# Patient Record
Sex: Female | Born: 1995 | Race: White | Hispanic: No | Marital: Single | Smoking: Current every day smoker
Health system: Southern US, Community
[De-identification: ages and names within clinical notes are randomized; demographics above are authoritative.]

## PROBLEM LIST (undated history)

## (undated) ENCOUNTER — Inpatient Hospital Stay (HOSPITAL_COMMUNITY): Payer: Self-pay

## (undated) DIAGNOSIS — K219 Gastro-esophageal reflux disease without esophagitis: Secondary | ICD-10-CM

## (undated) DIAGNOSIS — O99331 Smoking (tobacco) complicating pregnancy, first trimester: Secondary | ICD-10-CM

## (undated) DIAGNOSIS — K589 Irritable bowel syndrome without diarrhea: Secondary | ICD-10-CM

## (undated) HISTORY — PX: TONSILLECTOMY: SUR1361

## (undated) HISTORY — DX: Irritable bowel syndrome, unspecified: K58.9

## (undated) HISTORY — DX: Smoking (tobacco) complicating pregnancy, first trimester: O99.331

## (undated) HISTORY — DX: Gastro-esophageal reflux disease without esophagitis: K21.9

## (undated) HISTORY — PX: MULTIPLE TOOTH EXTRACTIONS: SHX2053

---
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
## 2014-07-12 NOTE — L&D Delivery Note (Signed)
Delivery Note  Patient presented after [facility] 04/24/15 @0800 . FB, cytotec and pitocin were used to augment labor. Patient was complete at 1000 this morning and pushed for less than an hour before successfully delivering her baby. She is GBS positive and received adequate treatment with PCN. Patient had primary PPH. Clots and flow of blood expressed with uterine massage after delivery. Pitocin was started, uterine massage continued. Fundus firmed appropriately. Ordered follow-up CBC.   At 10:53 AM a viable and healthy female was delivered via Vaginal, Spontaneous Delivery (Presentation: Vertex; Occiput Anterior).  APGAR: 9, 9; weight pending.   Placenta status: Intact, Spontaneous.  Cord: 3 vessels with the following complications: loose nuchal cord.  Cord pH: N/A  Anesthesia: Epidural  Episiotomy: None Lacerations:  None  Suture Repair: vicryl Est. Blood Loss (mL): 647  Mom to postpartum.  Baby to Couplet care / Skin to Skin.  Jamelle HaringHillary M Fitzgerald, MD [facility], PGY-1 04/25/2015, 11:19 AM  OB fellow attestation: Patient is a G1P1001 at 3516w1d who was admitted  I was gloved and present for delivery in its entirety.  Second stage of labor progressed, baby delivered after ~50 minutes of pushing. Patient had increased bleeding after delivery with uterine atony. Received routine pitocin and then gave methergine given continued bleeding. Will get f/u CBC  Complications: none  Lacerations: none  EBL: 647  Federico FlakeKimberly Niles Monetta Lick, MD 12:16 PM

## 2014-09-11 LAB — LAB REPORT - SCANNED: Preg Test, Ur: POSITIVE

## 2014-10-08 ENCOUNTER — Encounter: Payer: Self-pay | Admitting: *Deleted

## 2014-10-16 ENCOUNTER — Encounter: Payer: Self-pay | Admitting: Advanced Practice Midwife

## 2014-10-16 ENCOUNTER — Ambulatory Visit (INDEPENDENT_AMBULATORY_CARE_PROVIDER_SITE_OTHER): Payer: No Typology Code available for payment source | Admitting: Advanced Practice Midwife

## 2014-10-16 ENCOUNTER — Encounter: Payer: Self-pay | Admitting: *Deleted

## 2014-10-16 ENCOUNTER — Other Ambulatory Visit: Payer: Self-pay | Admitting: Advanced Practice Midwife

## 2014-10-16 VITALS — BP 130/62 | HR 73 | Temp 97.5°F | Ht 64.0 in | Wt 142.3 lb

## 2014-10-16 DIAGNOSIS — O3680X1 Pregnancy with inconclusive fetal viability, fetus 1: Secondary | ICD-10-CM | POA: Diagnosis not present

## 2014-10-16 DIAGNOSIS — Z3491 Encounter for supervision of normal pregnancy, unspecified, first trimester: Secondary | ICD-10-CM | POA: Insufficient documentation

## 2014-10-16 DIAGNOSIS — Z3481 Encounter for supervision of other normal pregnancy, first trimester: Secondary | ICD-10-CM

## 2014-10-16 DIAGNOSIS — E010 Iodine-deficiency related diffuse (endemic) goiter: Secondary | ICD-10-CM

## 2014-10-16 DIAGNOSIS — F17210 Nicotine dependence, cigarettes, uncomplicated: Secondary | ICD-10-CM

## 2014-10-16 DIAGNOSIS — O99331 Smoking (tobacco) complicating pregnancy, first trimester: Secondary | ICD-10-CM | POA: Insufficient documentation

## 2014-10-16 DIAGNOSIS — E049 Nontoxic goiter, unspecified: Secondary | ICD-10-CM

## 2014-10-16 HISTORY — DX: Smoking (tobacco) complicating pregnancy, first trimester: O99.331

## 2014-10-16 LAB — POCT URINALYSIS DIP (DEVICE)
Glucose, UA: NEGATIVE mg/dL
HGB URINE DIPSTICK: NEGATIVE
KETONES UR: NEGATIVE mg/dL
Leukocytes, UA: NEGATIVE
Nitrite: NEGATIVE
PROTEIN: 30 mg/dL — AB
Specific Gravity, Urine: 1.02 (ref 1.005–1.030)
UROBILINOGEN UA: 1 mg/dL (ref 0.0–1.0)
pH: 7 (ref 5.0–8.0)

## 2014-10-16 LAB — OB RESULTS CONSOLE GC/CHLAMYDIA
CHLAMYDIA, DNA PROBE: NEGATIVE
Gonorrhea: NEGATIVE

## 2014-10-16 LAB — OB RESULTS CONSOLE GBS: STREP GROUP B AG: POSITIVE

## 2014-10-16 MED ORDER — INTEGRA F 125-1 MG PO CAPS: 1.0000 | ORAL_CAPSULE | Freq: Every day | ORAL | Status: DC

## 2014-10-16 NOTE — Progress Notes (Signed)
C/o intermittent pelvic cramping.  Initial labs today and early 1hr gtt.  Had flu vaccine at work in 06/2014.  New OB packet given.   First trimester screen scheduled for 10/24/14 1130. [facility] faxed to [phone].

## 2014-10-16 NOTE — Progress Notes (Signed)
   Subjective:    Alison Mclean is a G1P0 9311w6d by LMP and US today being seen today for her first obstetrical visit.  Her obstetrical history is significant for smoker. Patient does intend to breast feed. Pregnancy history fully reviewed.  Patient reports nausea, no bleeding and no cramping.  Filed Vitals:   10/16/14 0941 10/16/14 1001  BP: 130/62   Pulse: 73   Temp: 97.5 F (36.4 C)   Height:  5\' 4"  (1.626 m)  Weight: 142 lb 4.8 oz (64.547 kg)     HISTORY: OB History  Gravida Para Term Preterm AB SAB TAB Ectopic Multiple Living  1             # Outcome Date GA Lbr Len/2nd Weight Sex Delivery Anes PTL Lv  1 Current              Past Medical History  Diagnosis Date  . GERD (gastroesophageal reflux disease)   . Irritable bowel syndrome (IBS)    Past Surgical History  Procedure Laterality Date  . Tonsillectomy     Family History  Problem Relation Age of Onset  . Hypertension Mother   . Diabetes Mother   . Hypertension Maternal Grandmother   . Diabetes Maternal Grandmother   . Cancer Maternal Grandfather     thyroid     Exam    Uterus:   11 week live IUP by US today. Pos cardiac activity   Pelvic Exam:    Perineum: No Hemorrhoids, Normal Perineum   Vulva: normal   Vagina:  normal mucosa, normal discharge   pH: NA   Adnexa: no mass, fullness, tenderness   Bony Pelvis: average and Unproven  System: Breast:  normal appearance, no masses or tenderness   Skin: normal coloration and turgor, no rashes    Neurologic: oriented, normal mood, grossly non-focal   Extremities: normal strength, tone, and muscle mass, No edema   HEENT sclera clear, anicteric, oropharynx clear, no lesions, neck supple with midline trachea and Thyromegaly, No palpable nodules   Mouth/Teeth mucous membranes moist, pharynx normal without lesions and dental hygiene good   Neck supple and no masses   Cardiovascular: regular rate and rhythm, no murmurs or gallops   Respiratory:  appears  well, vitals normal, no respiratory distress, acyanotic, normal RR, chest clear, no wheezing, crepitations, rhonchi, normal symmetric air entry   Abdomen: soft, non-tender; bowel sounds normal; no masses,  no organomegaly   Urinary: urethral meatus normal      Assessment:    Pregnancy: G1P0 1. Supervision of normal pregnancy in first trimester  - Prenatal Profile - Glucose Tolerance, 1 HR (50g) w/o Fasting - Culture, OB Urine - Prescript Monitor Profile(19) - Amniotic fluid index - Fe Fum-FePoly-FA-Vit C-Vit B3 (INTEGRA F) 125-1 MG CAPS; Take 1 capsule by mouth daily.  Dispense: 30 capsule; Refill: 8  2. Examination to determine fetal viability of pregnancy, fetus 1  - Amniotic fluid index (Check FHR, dating)  3. Tobacco smoking affecting pregnancy in first trimester, antepartum   4. Thyromegaly  - TSH   Plan:   Initial labs drawn. Prenatal vitamins. Problem list reviewed and updated. Genetic Screening discussed First Screen: ordered. Ultrasound discussed; fetal survey: requested. Follow up in 4 weeks. Smoking cessation encouraged. Handout given.   Dorathy KinsmanSMITH, Lisette Mancebo 10/16/2014

## 2014-10-16 NOTE — Patient Instructions (Addendum)
Pregnancy and Smoking Smoking during pregnancy is unhealthy for you and your developing baby. The addictive drug nicotine, carbon monoxide, and many other poisons are inhaled from a cigarette and carried through your bloodstream to your baby. Cigarette smoke contains more than 2,500 chemicals. It is not known which of these are harmful to a developing baby. However, both nicotine and carbon monoxide play a role in causing health problems in pregnancy. Smoking during pregnancy increases the risk of:  Birth defects in your baby, including heart defects.  Miscarriage and stillbirth.  Birth before 37 completed weeks of pregnancy (premature birth).  Pregnancy outside of the uterus (tubal pregnancy).  Attachment of the placenta over the opening of the uterus (placenta previa).  Detachment of the placenta before the baby's birth (placental abruption).  Breaking of the bag of waters before labor begins (premature rupture of membranes). HOW DOES SMOKING DURING PREGNANCY AFFECT MY BABY? Before Birth Smoking during pregnancy:  Decreases blood flow and oxygen to your baby.  Increases the heart rate of your baby.  Slows your baby's growth in the uterus (intrauterine growth retardation). After Birth Babies born to women who smoke during pregnancy are more likely to have a low birth weight. They are also at risk for:  Serious health problems, chronic or lifelong disabilities (cerebral palsy, mental retardation, learning problems), and death.  Sudden infant death syndrome (SIDS).  Lung and breathing problems. WHAT RESOURCES ARE AVAILABLE TO HELP ME STOP SMOKING?  Ask your health care provider for help to stop smoking. The following resources are available:  Counseling.  Psychological treatment.  Acupuncture.  Family intervention.  Hypnosis.  Nicotine supplements have not been studied enough to know if they are safe to use during pregnancy. They should only be considered when all other  methods fail, and if used under the close supervision of your health care provider.  Telephone QUIT lines. The national smoking cessation telephone hotline number is 1-800-QUIT NOW. FOR MORE INFORMATION  American Cancer Society: www.cancer.org  American Heart Association: www.heart.org  National Cancer Institute: www.cancer.gov  March of Dimes: www.marchofdimes.org Document Released: 11/09/2004 Document Revised: 07/03/2013 Document Reviewed: 05/28/2013 [facility] Patient Information 2015 [place], [place]. This information is not intended to replace advice given to you by your health care provider. Make sure you discuss any questions you have with your health care provider.  Smoking Cessation, Tips for Success If you are ready to quit smoking, congratulations! You have chosen to help yourself be healthier. Cigarettes bring nicotine, tar, carbon monoxide, and other irritants into your body. Your lungs, heart, and blood vessels will be able to work better without these poisons. There are many different ways to quit smoking. Nicotine gum, nicotine patches, a nicotine inhaler, or nicotine nasal spray can help with physical craving. Hypnosis, support groups, and medicines help break the habit of smoking. WHAT THINGS CAN I DO TO MAKE QUITTING EASIER?  Here are some tips to help you quit for good:  Pick a date when you will quit smoking completely. Tell all of your friends and family about your plan to quit on that date.  Do not try to slowly cut down on the number of cigarettes you are smoking. Pick a quit date and quit smoking completely starting on that day.  Throw away all cigarettes.   Clean and remove all ashtrays from your home, work, and car.  On a card, write down your reasons for quitting. Carry the card with you and read it when you get the urge to smoke.  Cleanse your body of nicotine. Drink enough water and fluids to keep your urine clear or pale yellow. Do this after quitting to  flush the nicotine from your body.  Learn to predict your moods. Do not let a bad situation be your excuse to have a cigarette. Some situations in your life might tempt you into wanting a cigarette.  Never have "just one" cigarette. It leads to wanting another and another. Remind yourself of your decision to quit.  Change habits associated with smoking. If you smoked while driving or when feeling stressed, try other activities to replace smoking. Stand up when drinking your coffee. Brush your teeth after eating. Sit in a different chair when you read the paper. Avoid alcohol while trying to quit, and try to drink fewer caffeinated beverages. Alcohol and caffeine may urge you to smoke.  Avoid foods and drinks that can trigger a desire to smoke, such as sugary or spicy foods and alcohol.  Ask people who smoke not to smoke around you.  Have something planned to do right after eating or having a cup of coffee. For example, plan to take a walk or exercise.  Try a relaxation exercise to calm you down and decrease your stress. Remember, you may be tense and nervous for the first 2 weeks after you quit, but this will pass.  Find new activities to keep your hands busy. Play with a pen, coin, or rubber band. Doodle or draw things on paper.  Brush your teeth right after eating. This will help cut down on the craving for the taste of tobacco after meals. You can also try mouthwash.   Use oral substitutes in place of cigarettes. Try using lemon drops, carrots, cinnamon sticks, or chewing gum. Keep them handy so they are available when you have the urge to smoke.  When you have the urge to smoke, try deep breathing.  Designate your home as a nonsmoking area.  If you are a heavy smoker, ask your health care provider about a prescription for nicotine chewing gum. It can ease your withdrawal from nicotine.  Reward yourself. Set aside the cigarette money you save and buy yourself something nice.  Look  for support from others. Join a support group or smoking cessation program. Ask someone at home or at work to help you with your plan to quit smoking.  Always ask yourself, "Do I need this cigarette or is this just a reflex?" Tell yourself, "Today, I choose not to smoke," or "I do not want to smoke." You are reminding yourself of your decision to quit.  Do not replace cigarette smoking with electronic cigarettes (commonly called e-cigarettes). The safety of e-cigarettes is unknown, and some may contain harmful chemicals.  If you relapse, do not give up! Plan ahead and think about what you will do the next time you get the urge to smoke. HOW WILL I FEEL WHEN I QUIT SMOKING? You may have symptoms of withdrawal because your body is used to nicotine (the addictive substance in cigarettes). You may crave cigarettes, be irritable, feel very hungry, cough often, get headaches, or have difficulty concentrating. The withdrawal symptoms are only temporary. They are strongest when you first quit but will go away within 10-14 days. When withdrawal symptoms occur, stay in control. Think about your reasons for quitting. Remind yourself that these are signs that your body is healing and getting used to being without cigarettes. Remember that withdrawal symptoms are easier to treat than the major diseases that  smoking can cause.  Even after the withdrawal is over, expect periodic urges to smoke. However, these cravings are generally short lived and will go away whether you smoke or not. Do not smoke! WHAT RESOURCES ARE AVAILABLE TO HELP ME QUIT SMOKING? Your health care provider can direct you to community resources or hospitals for support, which may include:  Group support.  Education.  Hypnosis.  Therapy. Document Released: 03/26/2004 Document Revised: 11/12/2013 Document Reviewed: 12/14/2012 [facility] Patient Information 2015 [place], [place]. This information is not intended to replace advice given to you by  your health care provider. Make sure you discuss any questions you have with your health care provider.   First Trimester of Pregnancy The first trimester of pregnancy is from week 1 until the end of week 12 (months 1 through 3). A week after a sperm fertilizes an egg, the egg will implant on the wall of the uterus. This embryo will begin to develop into a baby. Genes from you and your partner are forming the baby. The female genes determine whether the baby is a boy or a girl. At 6-8 weeks, the eyes and face are formed, and the heartbeat can be seen on ultrasound. At the end of 12 weeks, all the baby's organs are formed.  Now that you are pregnant, you will want to do everything you can to have a healthy baby. Two of the most important things are to get good prenatal care and to follow your health care provider's instructions. Prenatal care is all the medical care you receive before the baby's birth. This care will help prevent, find, and treat any problems during the pregnancy and childbirth. BODY CHANGES Your body goes through many changes during pregnancy. The changes vary from woman to woman.   You may gain or lose a couple of pounds at first.  You may feel sick to your stomach (nauseous) and throw up (vomit). If the vomiting is uncontrollable, call your health care provider.  You may tire easily.  You may develop headaches that can be relieved by medicines approved by your health care provider.  You may urinate more often. Painful urination may mean you have a bladder infection.  You may develop heartburn as a result of your pregnancy.  You may develop constipation because certain hormones are causing the muscles that push waste through your intestines to slow down.  You may develop hemorrhoids or swollen, bulging veins (varicose veins).  Your breasts may begin to grow larger and become tender. Your nipples may stick out more, and the tissue that surrounds them (areola) may become  darker.  Your gums may bleed and may be sensitive to brushing and flossing.  Dark spots or blotches (chloasma, mask of pregnancy) may develop on your face. This will likely fade after the baby is born.  Your menstrual periods will stop.  You may have a loss of appetite.  You may develop cravings for certain kinds of food.  You may have changes in your emotions from day to day, such as being excited to be pregnant or being concerned that something may go wrong with the pregnancy and baby.  You may have more vivid and strange dreams.  You may have changes in your hair. These can include thickening of your hair, rapid growth, and changes in texture. Some women also have hair loss during or after pregnancy, or hair that feels dry or thin. Your hair will most likely return to normal after your baby is born. WHAT TO  EXPECT AT YOUR PRENATAL VISITS During a routine prenatal visit:  You will be weighed to make sure you and the baby are growing normally.  Your blood pressure will be taken.  Your abdomen will be measured to track your baby's growth.  The fetal heartbeat will be listened to starting around week 10 or 12 of your pregnancy.  Test results from any previous visits will be discussed. Your health care provider may ask you:  How you are feeling.  If you are feeling the baby move.  If you have had any abnormal symptoms, such as leaking fluid, bleeding, severe headaches, or abdominal cramping.  If you have any questions. Other tests that may be performed during your first trimester include:  Blood tests to find your blood type and to check for the presence of any previous infections. They will also be used to check for low iron levels (anemia) and Rh antibodies. Later in the pregnancy, blood tests for diabetes will be done along with other tests if problems develop.  Urine tests to check for infections, diabetes, or protein in the urine.  An ultrasound to confirm the proper  growth and development of the baby.  An amniocentesis to check for possible genetic problems.  Fetal screens for spina bifida and Down syndrome.  You may need other tests to make sure you and the baby are doing well. HOME CARE INSTRUCTIONS  Medicines  Follow your health care provider's instructions regarding medicine use. Specific medicines may be either safe or unsafe to take during pregnancy.  Take your prenatal vitamins as directed.  If you develop constipation, try taking a stool softener if your health care provider approves. Diet  Eat regular, well-balanced meals. Choose a variety of foods, such as meat or vegetable-based protein, fish, milk and low-fat dairy products, vegetables, fruits, and whole grain breads and cereals. Your health care provider will help you determine the amount of weight gain that is right for you.  Avoid raw meat and uncooked cheese. These carry germs that can cause birth defects in the baby.  Eating four or five small meals rather than three large meals a day may help relieve nausea and vomiting. If you start to feel nauseous, eating a few soda crackers can be helpful. Drinking liquids between meals instead of during meals also seems to help nausea and vomiting.  If you develop constipation, eat more high-fiber foods, such as fresh vegetables or fruit and whole grains. Drink enough fluids to keep your urine clear or pale yellow. Activity and Exercise  Exercise only as directed by your health care provider. Exercising will help you:  Control your weight.  Stay in shape.  Be prepared for labor and delivery.  Experiencing pain or cramping in the lower abdomen or low back is a good sign that you should stop exercising. Check with your health care provider before continuing normal exercises.  Try to avoid standing for long periods of time. Move your legs often if you must stand in one place for a long time.  Avoid heavy lifting.  Wear low-heeled  shoes, and practice good posture.  You may continue to have sex unless your health care provider directs you otherwise. Relief of Pain or Discomfort  Wear a good support bra for breast tenderness.   Take warm sitz baths to soothe any pain or discomfort caused by hemorrhoids. Use hemorrhoid cream if your health care provider approves.   Rest with your legs elevated if you have leg cramps or low  back pain.  If you develop varicose veins in your legs, wear support hose. Elevate your feet for 15 minutes, 3-4 times a day. Limit salt in your diet. Prenatal Care  Schedule your prenatal visits by the twelfth week of pregnancy. They are usually scheduled monthly at first, then more often in the last 2 months before delivery.  Write down your questions. Take them to your prenatal visits.  Keep all your prenatal visits as directed by your health care provider. Safety  Wear your seat belt at all times when driving.  Make a list of emergency phone numbers, including numbers for family, friends, the hospital, and police and fire departments. General Tips  Ask your health care provider for a referral to a local prenatal education class. Begin classes no later than at the beginning of month 6 of your pregnancy.  Ask for help if you have counseling or nutritional needs during pregnancy. Your health care provider can offer advice or refer you to specialists for help with various needs.  Do not use hot tubs, steam rooms, or saunas.  Do not douche or use tampons or scented sanitary pads.  Do not cross your legs for long periods of time.  Avoid cat litter boxes and soil used by cats. These carry germs that can cause birth defects in the baby and possibly loss of the fetus by miscarriage or stillbirth.  Avoid all smoking, herbs, alcohol, and medicines not prescribed by your health care provider. Chemicals in these affect the formation and growth of the baby.  Schedule a dentist appointment. At  home, brush your teeth with a soft toothbrush and be gentle when you floss. SEEK MEDICAL CARE IF:   You have dizziness.  You have mild pelvic cramps, pelvic pressure, or nagging pain in the abdominal area.  You have persistent nausea, vomiting, or diarrhea.  You have a bad smelling vaginal discharge.  You have pain with urination.  You notice increased swelling in your face, hands, legs, or ankles. SEEK IMMEDIATE MEDICAL CARE IF:   You have a fever.  You are leaking fluid from your vagina.  You have spotting or bleeding from your vagina.  You have severe abdominal cramping or pain.  You have rapid weight gain or loss.  You vomit blood or material that looks like coffee grounds.  You are exposed to [place] measles and have never had them.  You are exposed to fifth disease or chickenpox.  You develop a severe headache.  You have shortness of breath.  You have any kind of trauma, such as from a fall or a car accident. Document Released: 06/22/2001 Document Revised: 11/12/2013 Document Reviewed: 05/08/2013 [facility] Patient Information 2015 [place], [place]. This information is not intended to replace advice given to you by your health care provider. Make sure you discuss any questions you have with your health care provider.

## 2014-10-16 NOTE — Progress Notes (Signed)
Bedside US for viability performed. Single IUP, FHR = 164 per PW doppler, CRL = 4.11cm (11w 0d), FM present

## 2014-10-17 ENCOUNTER — Encounter: Payer: Self-pay | Admitting: Advanced Practice Midwife

## 2014-10-17 ENCOUNTER — Other Ambulatory Visit: Payer: Self-pay | Admitting: Advanced Practice Midwife

## 2014-10-17 DIAGNOSIS — Z3682 Encounter for antenatal screening for nuchal translucency: Secondary | ICD-10-CM

## 2014-10-17 LAB — GLUCOSE TOLERANCE, 1 HOUR (50G) W/O FASTING: Glucose, 1 Hour GTT: 57 mg/dL — ABNORMAL LOW (ref 70–140)

## 2014-10-17 LAB — TSH: TSH: 0.811 u[IU]/mL (ref 0.350–4.500)

## 2014-10-17 LAB — GC/CHLAMYDIA PROBE AMP
CT Probe RNA: NEGATIVE
GC Probe RNA: NEGATIVE

## 2014-10-18 LAB — PRENATAL PROFILE (SOLSTAS)
ANTIBODY SCREEN: NEGATIVE
BASOS PCT: 0 % (ref 0–1)
Basophils Absolute: 0 10*3/uL (ref 0.0–0.1)
EOS ABS: 0.2 10*3/uL (ref 0.0–0.7)
Eosinophils Relative: 2 % (ref 0–5)
HEMATOCRIT: 37.3 % (ref 36.0–46.0)
HEP B S AG: NEGATIVE
HIV 1&2 Ab, 4th Generation: NONREACTIVE
Hemoglobin: 12.7 g/dL (ref 12.0–15.0)
Lymphocytes Relative: 18 % (ref 12–46)
Lymphs Abs: 1.4 10*3/uL (ref 0.7–4.0)
MCH: 31.5 pg (ref 26.0–34.0)
MCHC: 34 g/dL (ref 30.0–36.0)
MCV: 92.6 fL (ref 78.0–100.0)
MPV: 9.5 fL (ref 8.6–12.4)
Monocytes Absolute: 0.8 10*3/uL (ref 0.1–1.0)
Monocytes Relative: 10 % (ref 3–12)
NEUTROS ABS: 5.3 10*3/uL (ref 1.7–7.7)
Neutrophils Relative %: 70 % (ref 43–77)
PLATELETS: 249 10*3/uL (ref 150–400)
RBC: 4.03 MIL/uL (ref 3.87–5.11)
RDW: 13.5 % (ref 11.5–15.5)
Rh Type: POSITIVE
Rubella: 1.61 Index — ABNORMAL HIGH (ref <0.90)
WBC: 7.6 10*3/uL (ref 4.0–10.5)

## 2014-10-19 LAB — CULTURE, OB URINE

## 2014-10-20 LAB — CANNABANOIDS (GC/LC/MS), URINE: THC-COOH (GC/LC/MS), ur confirm: 670 ng/mL — AB (ref <5)

## 2014-10-21 ENCOUNTER — Other Ambulatory Visit: Payer: Self-pay | Admitting: Advanced Practice Midwife

## 2014-10-21 ENCOUNTER — Encounter: Payer: Self-pay | Admitting: Advanced Practice Midwife

## 2014-10-21 DIAGNOSIS — R8271 Bacteriuria: Secondary | ICD-10-CM | POA: Insufficient documentation

## 2014-10-21 MED ORDER — AMOXICILLIN 500 MG PO CAPS: 500.0000 mg | ORAL_CAPSULE | Freq: Three times a day (TID) | ORAL | Status: DC

## 2014-10-21 NOTE — Progress Notes (Signed)
GBS bacteriuria. Rx Amox.

## 2014-10-22 ENCOUNTER — Encounter: Payer: Self-pay | Admitting: *Deleted

## 2014-10-22 ENCOUNTER — Telehealth: Payer: Self-pay | Admitting: *Deleted

## 2014-10-22 LAB — PRESCRIPTION MONITORING PROFILE (19 PANEL)
Amphetamine/Meth: NEGATIVE ng/mL
Barbiturate Screen, Urine: NEGATIVE ng/mL
Benzodiazepine Screen, Urine: NEGATIVE ng/mL
Buprenorphine, Urine: NEGATIVE ng/mL
COCAINE METABOLITES: NEGATIVE ng/mL
Carisoprodol, Urine: NEGATIVE ng/mL
Creatinine, Urine: 274.83 mg/dL (ref >20.0)
ECSTASY: NEGATIVE ng/mL
Fentanyl, Ur: NEGATIVE ng/mL
MEPERIDINE UR: NEGATIVE ng/mL
METHADONE SCREEN, URINE: NEGATIVE ng/mL
Methaqualone: NEGATIVE ng/mL
NITRITES URINE, INITIAL: NEGATIVE ug/mL
Opiate Screen, Urine: NEGATIVE ng/mL
Oxycodone Screen, Ur: NEGATIVE ng/mL
PROPOXYPHENE: NEGATIVE ng/mL
Phencyclidine, Ur: NEGATIVE ng/mL
Tapentadol, urine: NEGATIVE ng/mL
Tramadol Scrn, Ur: NEGATIVE ng/mL
ZOLPIDEM, URINE: NEGATIVE ng/mL
pH, Initial: 8.4 pH (ref 4.5–8.9)

## 2014-10-22 NOTE — Telephone Encounter (Signed)
Called pt to notify her of need for antibiotic to treat UTI and heard a message stating that the number has been disconnected. Pt also does not have an active MyChart account. No additional contact numbers available in demographics. Pt will be sent a certified letter regarding test result and medication ordered.

## 2014-10-24 ENCOUNTER — Encounter (HOSPITAL_COMMUNITY): Payer: Self-pay

## 2014-10-24 ENCOUNTER — Ambulatory Visit (HOSPITAL_COMMUNITY)
Admission: RE | Admit: 2014-10-24 | Discharge: 2014-10-24 | Disposition: A | Discharge status: Home / Self Care | Payer: No Typology Code available for payment source | Source: Ambulatory Visit | Attending: Advanced Practice Midwife | Admitting: Advanced Practice Midwife

## 2014-10-24 DIAGNOSIS — Z3A12 12 weeks gestation of pregnancy: Secondary | ICD-10-CM | POA: Insufficient documentation

## 2014-10-24 DIAGNOSIS — Z3682 Encounter for antenatal screening for nuchal translucency: Secondary | ICD-10-CM

## 2014-10-24 DIAGNOSIS — Z36 Encounter for antenatal screening of mother: Secondary | ICD-10-CM | POA: Diagnosis not present

## 2014-11-03 ENCOUNTER — Encounter: Payer: Self-pay | Admitting: Advanced Practice Midwife

## 2014-11-03 DIAGNOSIS — F1290 Cannabis use, unspecified, uncomplicated: Secondary | ICD-10-CM | POA: Insufficient documentation

## 2014-11-05 ENCOUNTER — Telehealth: Payer: Self-pay | Admitting: General Practice

## 2014-11-05 ENCOUNTER — Telehealth (HOSPITAL_COMMUNITY): Payer: Self-pay | Admitting: *Deleted

## 2014-11-05 ENCOUNTER — Other Ambulatory Visit (HOSPITAL_COMMUNITY): Payer: Self-pay | Admitting: Advanced Practice Midwife

## 2014-11-05 NOTE — Telephone Encounter (Signed)
Patient called and left message requesting results. Called patient and informed her of normal results. Patient verbalized understanding and had no questions

## 2014-11-05 NOTE — Telephone Encounter (Signed)
Pt called name and DOB verified. Pt notified of normal first trimester screen result.

## 2014-11-13 ENCOUNTER — Encounter: Payer: No Typology Code available for payment source | Admitting: Advanced Practice Midwife

## 2014-11-15 ENCOUNTER — Telehealth: Payer: Self-pay | Admitting: *Deleted

## 2014-11-15 NOTE — Telephone Encounter (Signed)
Alison Mclean called and left a message stating the past couple of mornings when she gets up her stomach is hurting- " kind of feels like cramping or upset stomach". Wants a call back.  Called Alison Mclean and she c/o mostly in the morning stomach feeling upset, has thrown up a few times and also having cramps sometimes. She denies any vaginal bleeding . States last week she had some wetness on her panties, but none since.  We discussed she could still be having some morning sickness which would be normal. We also discussed her c/o more gas than usual- what otc she could use for gas/ indigestion and to avoid lying flat for 2 hours after meals and snacks. We discussed normal to have white or mucous vaginal discharge, but can not say for sure if wetness was fluid or discharge. She states she is feeling her baby move at times and denies frequent cramps, vaginal bleeding or severe pains. States she has sharp pains occasionally in her hips- we discussed round ligament pains. I advised her may always come to [facility] for evaluation and would recommend she come if she has increased cramping, any vaginal bleeding or wetness again or severe pain.  I reviewed her next ob appointment with her for next week. She voices understanding.

## 2014-11-20 ENCOUNTER — Encounter: Payer: Self-pay | Admitting: Physician Assistant

## 2014-11-20 ENCOUNTER — Ambulatory Visit (INDEPENDENT_AMBULATORY_CARE_PROVIDER_SITE_OTHER): Payer: No Typology Code available for payment source | Admitting: Physician Assistant

## 2014-11-20 VITALS — BP 127/63 | HR 62 | Wt 151.3 lb

## 2014-11-20 DIAGNOSIS — O26892 Other specified pregnancy related conditions, second trimester: Secondary | ICD-10-CM

## 2014-11-20 DIAGNOSIS — O2342 Unspecified infection of urinary tract in pregnancy, second trimester: Secondary | ICD-10-CM

## 2014-11-20 DIAGNOSIS — B951 Streptococcus, group B, as the cause of diseases classified elsewhere: Secondary | ICD-10-CM

## 2014-11-20 DIAGNOSIS — M549 Dorsalgia, unspecified: Secondary | ICD-10-CM

## 2014-11-20 DIAGNOSIS — O9989 Other specified diseases and conditions complicating pregnancy, childbirth and the puerperium: Secondary | ICD-10-CM

## 2014-11-20 DIAGNOSIS — R8271 Bacteriuria: Secondary | ICD-10-CM

## 2014-11-20 DIAGNOSIS — Z3402 Encounter for supervision of normal first pregnancy, second trimester: Secondary | ICD-10-CM

## 2014-11-20 DIAGNOSIS — O99891 Other specified diseases and conditions complicating pregnancy: Secondary | ICD-10-CM

## 2014-11-20 LAB — POCT URINALYSIS DIP (DEVICE)
Bilirubin Urine: NEGATIVE
Glucose, UA: NEGATIVE mg/dL
HGB URINE DIPSTICK: NEGATIVE
Ketones, ur: NEGATIVE mg/dL
Nitrite: NEGATIVE
PROTEIN: NEGATIVE mg/dL
Specific Gravity, Urine: 1.025 (ref 1.005–1.030)
UROBILINOGEN UA: 1 mg/dL (ref 0.0–1.0)
pH: 6.5 (ref 5.0–8.0)

## 2014-11-20 NOTE — Progress Notes (Signed)
Has cramping. Has not taken antibiotics for gbs uti.

## 2014-11-20 NOTE — Progress Notes (Signed)
15 weeks, stable. Denies VB, dysuria.  States she has occasional times of wet underwear and not sure why.   Back pain x several months.  Palpable knot on right in lower back.  Tylenol no help.   Spec exam done. Small to mod amt of thin, white discharge.  Sample collected for wet prep.   Ortho referral for back pain/knot.   Anatomy u/s scheduled.   RTC 4 weeks

## 2014-11-21 LAB — WET PREP, GENITAL: TRICH WET PREP: NONE SEEN

## 2014-11-23 ENCOUNTER — Telehealth: Payer: Self-pay | Admitting: Physician Assistant

## 2014-11-23 NOTE — Telephone Encounter (Signed)
Entered in error

## 2014-11-25 ENCOUNTER — Telehealth: Payer: Self-pay

## 2014-11-25 DIAGNOSIS — B9689 Other specified bacterial agents as the cause of diseases classified elsewhere: Secondary | ICD-10-CM

## 2014-11-25 DIAGNOSIS — B379 Candidiasis, unspecified: Secondary | ICD-10-CM

## 2014-11-25 DIAGNOSIS — N760 Acute vaginitis: Principal | ICD-10-CM

## 2014-11-25 MED ORDER — METRONIDAZOLE 500 MG PO TABS: 500.0000 mg | ORAL_TABLET | Freq: Two times a day (BID) | ORAL | Status: DC

## 2014-11-25 MED ORDER — TERCONAZOLE 0.4 % VA CREA: 1.0000 | TOPICAL_CREAM | Freq: Every day | VAGINAL | Status: AC

## 2014-11-25 NOTE — Telephone Encounter (Signed)
Medications e-prescribed. Called patient and informed her of results and RX at pharmacy. Patient verbalized understanding and gratitude. No questions or concerns.

## 2014-11-25 NOTE — Telephone Encounter (Signed)
-----   Message from Bertram DenverKaren E Teague Clark, PA-C sent at 11/23/2014  9:12 AM EDT ----- Mellody DrownWet prep positive for Bacterial Vaginosis and Yeast.  Please call patient to inform.  Please send in rx from Flagyl 500mg  bid x 7 days.  Disp #14.    Also rx for Terazole vaginal cream Use 1 app vaginally qhs x 7 nights.  Disp 45 grams.   Thanks, KTC

## 2014-11-26 ENCOUNTER — Telehealth: Payer: Self-pay | Admitting: *Deleted

## 2014-11-26 DIAGNOSIS — B373 Candidiasis of vulva and vagina: Secondary | ICD-10-CM

## 2014-11-26 DIAGNOSIS — B3731 Acute candidiasis of vulva and vagina: Secondary | ICD-10-CM

## 2014-11-26 NOTE — Telephone Encounter (Signed)
Patient called and stated that the cream prescribed for her was too expensive. Would like alternative.

## 2014-11-27 MED ORDER — FLUCONAZOLE 150 MG PO TABS: 150.0000 mg | ORAL_TABLET | Freq: Once | ORAL | Status: DC

## 2014-11-27 NOTE — Telephone Encounter (Signed)
Spoke with Alison Mclean she stated patient can use otc monistat 7 and we can prescribe a diflucan to take after the flagyl is completed. Patient informed and had no further questions.

## 2014-11-29 ENCOUNTER — Encounter: Payer: Self-pay | Admitting: General Practice

## 2014-12-18 ENCOUNTER — Ambulatory Visit (HOSPITAL_COMMUNITY)
Admission: RE | Admit: 2014-12-18 | Discharge: 2014-12-18 | Disposition: A | Discharge status: Home / Self Care | Payer: No Typology Code available for payment source | Source: Ambulatory Visit | Attending: Physician Assistant | Admitting: Physician Assistant

## 2014-12-18 ENCOUNTER — Encounter: Payer: Self-pay | Admitting: Obstetrics and Gynecology

## 2014-12-18 ENCOUNTER — Ambulatory Visit (INDEPENDENT_AMBULATORY_CARE_PROVIDER_SITE_OTHER): Payer: No Typology Code available for payment source | Admitting: Obstetrics and Gynecology

## 2014-12-18 VITALS — BP 112/64 | HR 57 | Wt 159.2 lb

## 2014-12-18 DIAGNOSIS — Z36 Encounter for antenatal screening of mother: Secondary | ICD-10-CM | POA: Diagnosis present

## 2014-12-18 DIAGNOSIS — F17210 Nicotine dependence, cigarettes, uncomplicated: Secondary | ICD-10-CM

## 2014-12-18 DIAGNOSIS — Z3A19 19 weeks gestation of pregnancy: Secondary | ICD-10-CM | POA: Diagnosis not present

## 2014-12-18 DIAGNOSIS — R8271 Bacteriuria: Secondary | ICD-10-CM

## 2014-12-18 DIAGNOSIS — O99332 Smoking (tobacco) complicating pregnancy, second trimester: Secondary | ICD-10-CM | POA: Insufficient documentation

## 2014-12-18 DIAGNOSIS — B951 Streptococcus, group B, as the cause of diseases classified elsewhere: Secondary | ICD-10-CM

## 2014-12-18 DIAGNOSIS — O2332 Infections of other parts of urinary tract in pregnancy, second trimester: Secondary | ICD-10-CM

## 2014-12-18 DIAGNOSIS — Z3402 Encounter for supervision of normal first pregnancy, second trimester: Secondary | ICD-10-CM

## 2014-12-18 LAB — POCT URINALYSIS DIP (DEVICE)
Bilirubin Urine: NEGATIVE
Glucose, UA: NEGATIVE mg/dL
Hgb urine dipstick: NEGATIVE
KETONES UR: NEGATIVE mg/dL
Leukocytes, UA: NEGATIVE
Nitrite: NEGATIVE
PH: 6.5 (ref 5.0–8.0)
Protein, ur: NEGATIVE mg/dL
Specific Gravity, Urine: 1.025 (ref 1.005–1.030)
Urobilinogen, UA: 1 mg/dL (ref 0.0–1.0)

## 2014-12-18 NOTE — Patient Instructions (Addendum)
Second Trimester of Pregnancy The second trimester is from week 13 through week 28, months 4 through 6. The second trimester is often a time when you feel your best. Your body has also adjusted to being pregnant, and you begin to feel better physically. Usually, morning sickness has lessened or quit completely, you may have more energy, and you may have an increase in appetite. The second trimester is also a time when the fetus is growing rapidly. At the end of the sixth month, the fetus is about 9 inches long and weighs about 1 pounds. You will likely begin to feel the baby move (quickening) between 18 and 20 weeks of the pregnancy. BODY CHANGES Your body goes through many changes during pregnancy. The changes vary from woman to woman.   Your weight will continue to increase. You will notice your lower abdomen bulging out.  You may begin to get stretch marks on your hips, abdomen, and breasts.  You may develop headaches that can be relieved by medicines approved by your health care provider.  You may urinate more often because the fetus is pressing on your bladder.  You may develop or continue to have heartburn as a result of your pregnancy.  You may develop constipation because certain hormones are causing the muscles that push waste through your intestines to slow down.  You may develop hemorrhoids or swollen, bulging veins (varicose veins).  You may have back pain because of the weight gain and pregnancy hormones relaxing your joints between the bones in your pelvis and as a result of a shift in weight and the muscles that support your balance.  Your breasts will continue to grow and be tender.  Your gums may bleed and may be sensitive to brushing and flossing.  Dark spots or blotches (chloasma, mask of pregnancy) may develop on your face. This will likely fade after the baby is born.  A dark line from your belly button to the pubic area (linea nigra) may appear. This will likely fade  after the baby is born.  You may have changes in your hair. These can include thickening of your hair, rapid growth, and changes in texture. Some women also have hair loss during or after pregnancy, or hair that feels dry or thin. Your hair will most likely return to normal after your baby is born. WHAT TO EXPECT AT YOUR PRENATAL VISITS During a routine prenatal visit:  You will be weighed to make sure you and the fetus are growing normally.  Your blood pressure will be taken.  Your abdomen will be measured to track your baby's growth.  The fetal heartbeat will be listened to.  Any test results from the previous visit will be discussed. Your health care provider may ask you:  How you are feeling.  If you are feeling the baby move.  If you have had any abnormal symptoms, such as leaking fluid, bleeding, severe headaches, or abdominal cramping.  If you have any questions. Other tests that may be performed during your second trimester include:  Blood tests that check for:  Low iron levels (anemia).  Gestational diabetes (between 24 and 28 weeks).  Rh antibodies.  Urine tests to check for infections, diabetes, or protein in the urine.  An ultrasound to confirm the proper growth and development of the baby.  An amniocentesis to check for possible genetic problems.  Fetal screens for spina bifida and Down syndrome. HOME CARE INSTRUCTIONS   Avoid all smoking, herbs, alcohol, and unprescribed   drugs. These chemicals affect the formation and growth of the baby.  Follow your health care provider's instructions regarding medicine use. There are medicines that are either safe or unsafe to take during pregnancy.  Exercise only as directed by your health care provider. Experiencing uterine cramps is a good sign to stop exercising.  Continue to eat regular, healthy meals.  Wear a good support bra for breast tenderness.  Do not use hot tubs, steam rooms, or saunas.  Wear your  seat belt at all times when driving.  Avoid raw meat, uncooked cheese, cat litter boxes, and soil used by cats. These carry germs that can cause birth defects in the baby.  Take your prenatal vitamins.  Try taking a stool softener (if your health care provider approves) if you develop constipation. Eat more high-fiber foods, such as fresh vegetables or fruit and whole grains. Drink plenty of fluids to keep your urine clear or pale yellow.  Take warm sitz baths to soothe any pain or discomfort caused by hemorrhoids. Use hemorrhoid cream if your health care provider approves.  If you develop varicose veins, wear support hose. Elevate your feet for 15 minutes, 3-4 times a day. Limit salt in your diet.  Avoid heavy lifting, wear low heel shoes, and practice good posture.  Rest with your legs elevated if you have leg cramps or low back pain.  Visit your dentist if you have not gone yet during your pregnancy. Use a soft toothbrush to brush your teeth and be gentle when you floss.  A sexual relationship may be continued unless your health care provider directs you otherwise.  Continue to go to all your prenatal visits as directed by your health care provider. SEEK MEDICAL CARE IF:   You have dizziness.  You have mild pelvic cramps, pelvic pressure, or nagging pain in the abdominal area.  You have persistent nausea, vomiting, or diarrhea.  You have a bad smelling vaginal discharge.  You have pain with urination. SEEK IMMEDIATE MEDICAL CARE IF:   You have a fever.  You are leaking fluid from your vagina.  You have spotting or bleeding from your vagina.  You have severe abdominal cramping or pain.  You have rapid weight gain or loss.  You have shortness of breath with chest pain.  You notice sudden or extreme swelling of your face, hands, ankles, feet, or legs.  You have not felt your baby move in over an hour.  You have severe headaches that do not go away with  medicine.  You have vision changes. Document Released: 06/22/2001 Document Revised: 07/03/2013 Document Reviewed: 08/29/2012 [facility] LPExitCare Patient Information 2015 [place], [place]. This information is not intended to replace advice given to you by your health care provider. Make sure you discuss any questions you have with your health care provider. Smoking Cessation Quitting smoking is important to your health and has many advantages. However, it is not always easy to quit since nicotine is a very addictive drug. Oftentimes, people try 3 times or more before being able to quit. This document explains the best ways for you to prepare to quit smoking. Quitting takes hard work and a lot of effort, but you can do it. ADVANTAGES OF QUITTING SMOKING  You will live longer, feel better, and live better.  Your body will feel the impact of quitting smoking almost immediately.  Within 20 minutes, blood pressure decreases. Your pulse returns to its normal level.  After 8 hours, carbon monoxide levels in the blood return  to normal. Your oxygen level increases.  After 24 hours, the chance of having a heart attack starts to decrease. Your breath, hair, and body stop smelling like smoke.  After 48 hours, damaged nerve endings begin to recover. Your sense of taste and smell improve.  After 72 hours, the body is virtually free of nicotine. Your bronchial tubes relax and breathing becomes easier.  After 2 to 12 weeks, lungs can hold more air. Exercise becomes easier and circulation improves.  The risk of having a heart attack, stroke, cancer, or lung disease is greatly reduced.  After 1 year, the risk of coronary heart disease is cut in half.  After 5 years, the risk of stroke falls to the same as a nonsmoker.  After 10 years, the risk of lung cancer is cut in half and the risk of other cancers decreases significantly.  After 15 years, the risk of coronary heart disease drops, usually to the level of a  nonsmoker.  If you are pregnant, quitting smoking will improve your chances of having a healthy baby.  The people you live with, especially any children, will be healthier.  You will have extra money to spend on things other than cigarettes. QUESTIONS TO THINK ABOUT BEFORE ATTEMPTING TO QUIT You may want to talk about your answers with your health care provider.  Why do you want to quit?  If you tried to quit in the past, what helped and what did not?  What will be the most difficult situations for you after you quit? How will you plan to handle them?  Who can help you through the tough times? Your family? Friends? A health care provider?  What pleasures do you get from smoking? What ways can you still get pleasure if you quit? Here are some questions to ask your health care provider:  How can you help me to be successful at quitting?  What medicine do you think would be best for me and how should I take it?  What should I do if I need more help?  What is smoking withdrawal like? How can I get information on withdrawal? GET READY  Set a quit date.  Change your environment by getting rid of all cigarettes, ashtrays, matches, and lighters in your home, car, or work. Do not let people smoke in your home.  Review your past attempts to quit. Think about what worked and what did not. GET SUPPORT AND ENCOURAGEMENT You have a better chance of being successful if you have help. You can get support in many ways.  Tell your family, friends, and coworkers that you are going to quit and need their support. Ask them not to smoke around you.  Get individual, group, or telephone counseling and support. Programs are available at [facility]. Call your local health department for information about programs in your area.  Spiritual beliefs and practices may help some smokers quit.  Download a "quit meter" on your computer to keep track of quit statistics, such as how  long you have gone without smoking, cigarettes not smoked, and money saved.  Get a self-help book about quitting smoking and staying off tobacco. LEARN NEW SKILLS AND BEHAVIORS  Distract yourself from urges to smoke. Talk to someone, go for a walk, or occupy your time with a task.  Change your normal routine. Take a different route to work. Drink tea instead of coffee. Eat breakfast in a different place.  Reduce your stress. Take a hot  bath, exercise, or read a book.  Plan something enjoyable to do every day. Reward yourself for not smoking.  Explore interactive web-based programs that specialize in helping you quit. GET MEDICINE AND USE IT CORRECTLY Medicines can help you stop smoking and decrease the urge to smoke. Combining medicine with the above behavioral methods and support can greatly increase your chances of successfully quitting smoking.  Nicotine replacement therapy helps deliver nicotine to your body without the negative effects and risks of smoking. Nicotine replacement therapy includes nicotine gum, lozenges, inhalers, nasal sprays, and skin patches. Some may be available over-the-counter and others require a prescription.  Antidepressant medicine helps people abstain from smoking, but how this works is unknown. This medicine is available by prescription.  Nicotinic receptor partial agonist medicine simulates the effect of nicotine in your brain. This medicine is available by prescription. Ask your health care provider for advice about which medicines to use and how to use them based on your health history. Your health care provider will tell you what side effects to look out for if you choose to be on a medicine or therapy. Carefully read the information on the package. Do not use any other product containing nicotine while using a nicotine replacement product.  RELAPSE OR DIFFICULT SITUATIONS Most relapses occur within the first 3 months after quitting. Do not be discouraged  if you start smoking again. Remember, most people try several times before finally quitting. You may have symptoms of withdrawal because your body is used to nicotine. You may crave cigarettes, be irritable, feel very hungry, cough often, get headaches, or have difficulty concentrating. The withdrawal symptoms are only temporary. They are strongest when you first quit, but they will go away within 10-14 days. To reduce the chances of relapse, try to:  Avoid drinking alcohol. Drinking lowers your chances of successfully quitting.  Reduce the amount of caffeine you consume. Once you quit smoking, the amount of caffeine in your body increases and can give you symptoms, such as a rapid heartbeat, sweating, and anxiety.  Avoid smokers because they can make you want to smoke.  Do not let weight gain distract you. Many smokers will gain weight when they quit, usually less than 10 pounds. Eat a healthy diet and stay active. You can always lose the weight gained after you quit.  Find ways to improve your mood other than smoking. FOR MORE INFORMATION  www.smokefree.gov  Document Released: 06/22/2001 Document Revised: 11/12/2013 Document Reviewed: 10/07/2011 [facility] Patient Information 2015 [place]Exit[place], [place]LL[place]. This information is not intended to replace advice given to you by your health care provider. Make sure you discuss any questions you have with your health care provider.  Call 1-800-quitnow or go to quitnow[place].com

## 2014-12-18 NOTE — Progress Notes (Signed)
Reports a "smell down there" Reviewed tip of week with patient

## 2014-12-18 NOTE — Progress Notes (Signed)
Subjective:   Alison Mclean is a 19 y.o. G1P0 at 185w6d being seen today for her obstetrical visit.  Patient reports smoking 1/2ppd and wants to quit but can't so far. .   Fetal  . Denies contractions leaking of fluid.   The following portions of the patient's history were reviewed and updated as appropriate: allergies, current medications, past family history, past medical history, past social history, past surgical history and problem list.   Objective:  LMP 08/01/2014 (Exact Date)  Fetal Status:           General:  Alert, oriented and cooperative. Patient is in no acute distress.  Skin: Skin is warm and dry. No rash noted.   Cardiovascular: Normal heart rate noted, normal rhythm  Respiratory: Effort and breath sounds normal, no problems with respiration noted  Abdomen: Soft, gravid, appropriate for gestational age.       Vaginal:  .       Cervix: Deferred  Extremities: Normal range of motion.     Mental Status: Normal mood and affect. Normal behavior. Normal judgment and thought content.   Urinalysis:       Assessment and Plan:   Pregnancy: G1P0 at 265w6d  1. Encounter for supervision of normal first pregnancy in second trimester   2. Group B streptococcal bacteriuria Smoker:    Labor symptoms and precations: vaginal bleeding, contractions and leaking of fluid reviewed in detail.  Fetal movement precautions also reviewed. Please refer to After Visit Summary for other counseling recommendations.   Return in about 1 month (around 01/17/2015).  AVS and quitnownc info given.  F/U on smoking cessation efforts next visit. Anatomic US later today Explained GBS carriage Will get MSAFP today  Danae Orleanseirdre C [place], CNM

## 2014-12-19 DIAGNOSIS — Z3689 Encounter for other specified antenatal screening: Secondary | ICD-10-CM | POA: Insufficient documentation

## 2014-12-19 DIAGNOSIS — O35EXX0 Maternal care for other (suspected) fetal abnormality and damage, fetal genitourinary anomalies, not applicable or unspecified: Secondary | ICD-10-CM | POA: Insufficient documentation

## 2014-12-19 DIAGNOSIS — O358XX0 Maternal care for other (suspected) fetal abnormality and damage, not applicable or unspecified: Secondary | ICD-10-CM | POA: Insufficient documentation

## 2014-12-19 DIAGNOSIS — Z3A19 19 weeks gestation of pregnancy: Secondary | ICD-10-CM | POA: Insufficient documentation

## 2014-12-20 LAB — ALPHA FETOPROTEIN, MATERNAL
AFP: 38.1 ng/mL
Curr Gest Age: 19.6 wks.days
MoM for AFP: 0.69
OPEN SPINA BIFIDA: NEGATIVE
Osb Risk: <1:27300 {titer}

## 2015-01-15 ENCOUNTER — Ambulatory Visit (INDEPENDENT_AMBULATORY_CARE_PROVIDER_SITE_OTHER): Payer: No Typology Code available for payment source | Admitting: Certified Nurse Midwife

## 2015-01-15 VITALS — BP 100/68 | HR 62 | Temp 98.2°F | Wt 170.3 lb

## 2015-01-15 DIAGNOSIS — Z3402 Encounter for supervision of normal first pregnancy, second trimester: Secondary | ICD-10-CM

## 2015-01-15 DIAGNOSIS — N898 Other specified noninflammatory disorders of vagina: Secondary | ICD-10-CM

## 2015-01-15 LAB — POCT URINALYSIS DIP (DEVICE)
Bilirubin Urine: NEGATIVE
Glucose, UA: NEGATIVE mg/dL
Hgb urine dipstick: NEGATIVE
Ketones, ur: NEGATIVE mg/dL
Leukocytes, UA: NEGATIVE
Nitrite: NEGATIVE
Protein, ur: NEGATIVE mg/dL
Specific Gravity, Urine: 1.02 (ref 1.005–1.030)
Urobilinogen, UA: 0.2 mg/dL (ref 0.0–1.0)
pH: 6 (ref 5.0–8.0)

## 2015-01-15 NOTE — Addendum Note (Signed)
Addended by: Doreen SalvageATUM, Romeka Scifres M on: 01/15/2015 11:33 AM   Modules accepted: Orders

## 2015-01-15 NOTE — Progress Notes (Signed)
Schedule 1 hour gtt next visit. C/o vaginal discharge. Blind wet prep collected. States baby is moving well.

## 2015-01-15 NOTE — Patient Instructions (Signed)
Second Trimester of Pregnancy The second trimester is from week 13 through week 28, months 4 through 6. The second trimester is often a time when you feel your best. Your body has also adjusted to being pregnant, and you begin to feel better physically. Usually, morning sickness has lessened or quit completely, you may have more energy, and you may have an increase in appetite. The second trimester is also a time when the fetus is growing rapidly. At the end of the sixth month, the fetus is about 9 inches long and weighs about 1 pounds. You will likely begin to feel the baby move (quickening) between 18 and 20 weeks of the pregnancy. BODY CHANGES Your body goes through many changes during pregnancy. The changes vary from woman to woman.   Your weight will continue to increase. You will notice your lower abdomen bulging out.  You may begin to get stretch marks on your hips, abdomen, and breasts.  You may develop headaches that can be relieved by medicines approved by your health care provider.  You may urinate more often because the fetus is pressing on your bladder.  You may develop or continue to have heartburn as a result of your pregnancy.  You may develop constipation because certain hormones are causing the muscles that push waste through your intestines to slow down.  You may develop hemorrhoids or swollen, bulging veins (varicose veins).  You may have back pain because of the weight gain and pregnancy hormones relaxing your joints between the bones in your pelvis and as a result of a shift in weight and the muscles that support your balance.  Your breasts will continue to grow and be tender.  Your gums may bleed and may be sensitive to brushing and flossing.  Dark spots or blotches (chloasma, mask of pregnancy) may develop on your face. This will likely fade after the baby is born.  A dark line from your belly button to the pubic area (linea nigra) may appear. This will likely fade  after the baby is born.  You may have changes in your hair. These can include thickening of your hair, rapid growth, and changes in texture. Some women also have hair loss during or after pregnancy, or hair that feels dry or thin. Your hair will most likely return to normal after your baby is born. WHAT TO EXPECT AT YOUR PRENATAL VISITS During a routine prenatal visit:  You will be weighed to make sure you and the fetus are growing normally.  Your blood pressure will be taken.  Your abdomen will be measured to track your baby's growth.  The fetal heartbeat will be listened to.  Any test results from the previous visit will be discussed. Your health care provider may ask you:  How you are feeling.  If you are feeling the baby move.  If you have had any abnormal symptoms, such as leaking fluid, bleeding, severe headaches, or abdominal cramping.  If you have any questions. Other tests that may be performed during your second trimester include:  Blood tests that check for:  Low iron levels (anemia).  Gestational diabetes (between 24 and 28 weeks).  Rh antibodies.  Urine tests to check for infections, diabetes, or protein in the urine.  An ultrasound to confirm the proper growth and development of the baby.  An amniocentesis to check for possible genetic problems.  Fetal screens for spina bifida and Down syndrome. HOME CARE INSTRUCTIONS   Avoid all smoking, herbs, alcohol, and unprescribed   drugs. These chemicals affect the formation and growth of the baby.  Follow your health care provider's instructions regarding medicine use. There are medicines that are either safe or unsafe to take during pregnancy.  Exercise only as directed by your health care provider. Experiencing uterine cramps is a good sign to stop exercising.  Continue to eat regular, healthy meals.  Wear a good support bra for breast tenderness.  Do not use hot tubs, steam rooms, or saunas.  Wear your  seat belt at all times when driving.  Avoid raw meat, uncooked cheese, cat litter boxes, and soil used by cats. These carry germs that can cause birth defects in the baby.  Take your prenatal vitamins.  Try taking a stool softener (if your health care provider approves) if you develop constipation. Eat more high-fiber foods, such as fresh vegetables or fruit and whole grains. Drink plenty of fluids to keep your urine clear or pale yellow.  Take warm sitz baths to soothe any pain or discomfort caused by hemorrhoids. Use hemorrhoid cream if your health care provider approves.  If you develop varicose veins, wear support hose. Elevate your feet for 15 minutes, 3-4 times a day. Limit salt in your diet.  Avoid heavy lifting, wear low heel shoes, and practice good posture.  Rest with your legs elevated if you have leg cramps or low back pain.  Visit your dentist if you have not gone yet during your pregnancy. Use a soft toothbrush to brush your teeth and be gentle when you floss.  A sexual relationship may be continued unless your health care provider directs you otherwise.  Continue to go to all your prenatal visits as directed by your health care provider. SEEK MEDICAL CARE IF:   You have dizziness.  You have mild pelvic cramps, pelvic pressure, or nagging pain in the abdominal area.  You have persistent nausea, vomiting, or diarrhea.  You have a bad smelling vaginal discharge.  You have pain with urination. SEEK IMMEDIATE MEDICAL CARE IF:   You have a fever.  You are leaking fluid from your vagina.  You have spotting or bleeding from your vagina.  You have severe abdominal cramping or pain.  You have rapid weight gain or loss.  You have shortness of breath with chest pain.  You notice sudden or extreme swelling of your face, hands, ankles, feet, or legs.  You have not felt your baby move in over an hour.  You have severe headaches that do not go away with  medicine.  You have vision changes. Document Released: 06/22/2001 Document Revised: 07/03/2013 Document Reviewed: 08/29/2012 [employer] Patient Information 2015 [employer]. This information is not intended to replace advice given to you by your health care provider. Make sure you discuss any questions you have with your health care provider.  

## 2015-01-15 NOTE — Progress Notes (Signed)
Breastfeeding tip of the week reviewed Pt is complaining of constipation Pt states her discharge smells like copper,

## 2015-01-16 LAB — WET PREP, GENITAL: Trich, Wet Prep: NONE SEEN

## 2015-01-20 ENCOUNTER — Telehealth: Payer: Self-pay | Admitting: *Deleted

## 2015-01-20 MED ORDER — METRONIDAZOLE 500 MG PO TABS: 500.0000 mg | ORAL_TABLET | Freq: Two times a day (BID) | ORAL | Status: DC

## 2015-01-20 MED ORDER — FLUCONAZOLE 150 MG PO TABS: 150.0000 mg | ORAL_TABLET | Freq: Once | ORAL | Status: DC

## 2015-01-20 NOTE — Telephone Encounter (Addendum)
Received voicemail message left on nurse line on 01/17/15 at 1201.  Patient states she would like to get her test results from Wednesday.  Requests a return call.  7/11  1652  Called pt and informed her of wet prep results. Rx sent to pharmacy for +BV and +yeast.  Pt advised to finish metronidazole before taking fluconazole. Pt voiced understanding.  Diane Day RNC

## 2015-02-12 ENCOUNTER — Ambulatory Visit (INDEPENDENT_AMBULATORY_CARE_PROVIDER_SITE_OTHER): Payer: Medicaid Other | Admitting: Advanced Practice Midwife

## 2015-02-12 VITALS — BP 123/65 | HR 66 | Temp 97.9°F | Wt 178.9 lb

## 2015-02-12 DIAGNOSIS — O358XX0 Maternal care for other (suspected) fetal abnormality and damage, not applicable or unspecified: Secondary | ICD-10-CM

## 2015-02-12 DIAGNOSIS — O283 Abnormal ultrasonic finding on antenatal screening of mother: Secondary | ICD-10-CM

## 2015-02-12 DIAGNOSIS — Z3492 Encounter for supervision of normal pregnancy, unspecified, second trimester: Secondary | ICD-10-CM | POA: Diagnosis present

## 2015-02-12 DIAGNOSIS — O35EXX0 Maternal care for other (suspected) fetal abnormality and damage, fetal genitourinary anomalies, not applicable or unspecified: Secondary | ICD-10-CM

## 2015-02-12 LAB — POCT URINALYSIS DIP (DEVICE)
BILIRUBIN URINE: NEGATIVE
GLUCOSE, UA: NEGATIVE mg/dL
Hgb urine dipstick: NEGATIVE
KETONES UR: NEGATIVE mg/dL
Leukocytes, UA: NEGATIVE
Nitrite: NEGATIVE
Protein, ur: NEGATIVE mg/dL
SPECIFIC GRAVITY, URINE: 1.02 (ref 1.005–1.030)
UROBILINOGEN UA: 0.2 mg/dL (ref 0.0–1.0)
pH: 7 (ref 5.0–8.0)

## 2015-02-12 LAB — CBC
HCT: 35.1 % — ABNORMAL LOW (ref 36.0–46.0)
Hemoglobin: 12.1 g/dL (ref 12.0–15.0)
MCH: 32.4 pg (ref 26.0–34.0)
MCHC: 34.5 g/dL (ref 30.0–36.0)
MCV: 94.1 fL (ref 78.0–100.0)
MPV: 9.6 fL (ref 8.6–12.4)
Platelets: 217 10*3/uL (ref 150–400)
RBC: 3.73 MIL/uL — ABNORMAL LOW (ref 3.87–5.11)
RDW: 12.9 % (ref 11.5–15.5)
WBC: 9 10*3/uL (ref 4.0–10.5)

## 2015-02-12 NOTE — Progress Notes (Signed)
Subjective:  Alison Mclean is a 19 y.o. G1P0 at [redacted]w[redacted]d being seen today for ongoing prenatal care.  Patient reports no complaints.  Contractions: Not present.  Vag. Bleeding: None. Movement: Present. Denies leaking of fluid.   The following portions of the patient's history were reviewed and updated as appropriate: allergies, current medications, past family history, past medical history, past social history, past surgical history and problem list.   Objective:   Filed Vitals:   02/12/15 0931  BP: 123/65  Pulse: 66  Temp: 97.9 F (36.6 C)  Weight: 178 lb 14.4 oz (81.149 kg)    Fetal Status: Fetal Heart Rate (bpm): 136 Fundal Height: 27 cm Movement: Present     General:  Alert, oriented and cooperative. Patient is in no acute distress.  Skin: Skin is warm and dry. No rash noted.   Cardiovascular: Normal heart rate noted  Respiratory: Normal respiratory effort, no problems with respiration noted  Abdomen: Soft, gravid, appropriate for gestational age. Pain/Pressure: Present     Vaginal: Vag. Bleeding: None.       Cervix: Not evaluated        Extremities: Normal range of motion.  Edema: Trace  Mental Status: Normal mood and affect. Normal behavior. Normal judgment and thought content.   Urinalysis: Urine Protein: Negative Urine Glucose: Negative  Assessment and Plan:  Pregnancy: G1P0 at [redacted]w[redacted]d  1. Prenatal care in second trimester  - Glucose Tolerance, 1 HR (50g) w/o Fasting - CBC - RPR - HIV antibody (with reflex) - US OB Follow Up; Future  2. Pyelectasis of fetus on prenatal ultrasound Reviewed results with pt. Questions answered.  F/U Ultrasound scheduled to evaluate and to complete anatomy.  Preterm labor symptoms and general obstetric precautions including but not limited to vaginal bleeding, contractions, leaking of fluid and fetal movement were reviewed in detail with the patient. Please refer to After Visit Summary for other counseling recommendations.  Return in  about 2 weeks (around 02/26/2015).   Hurshel Party, CNM

## 2015-02-12 NOTE — Progress Notes (Deleted)
Subjective:  Alison Mclean is a 19 y.o. G1P0 at [redacted]w[redacted]d being seen today for ongoing prenatal care.  Patient reports {sx:14538}.  Contractions: Not present.  Vag. Bleeding: None. Movement: Present. Denies leaking of fluid.   The following portions of the patient's history were reviewed and updated as appropriate: allergies, current medications, past family history, past medical history, past social history, past surgical history and problem list.   Objective:   Filed Vitals:   02/12/15 0931  BP: 123/65  Pulse: 66  Temp: 97.9 F (36.6 C)  Weight: 178 lb 14.4 oz (81.149 kg)    Fetal Status: Fetal Heart Rate (bpm): 136 Fundal Height: 27 cm Movement: Present     General:  Alert, oriented and cooperative. Patient is in no acute distress.  Skin: Skin is warm and dry. No rash noted.   Cardiovascular: Normal heart rate noted  Respiratory: Normal respiratory effort, no problems with respiration noted  Abdomen: Soft, gravid, appropriate for gestational age. Pain/Pressure: Present     Vaginal: Vag. Bleeding: None.       Cervix: {Blank single:19197::"Not evaluated","Exam revealed"}        Extremities: Normal range of motion.  Edema: Trace  Mental Status: Normal mood and affect. Normal behavior. Normal judgment and thought content.   Urinalysis: Urine Protein: Negative Urine Glucose: Negative  Assessment and Plan:  Pregnancy: G1P0 at [redacted]w[redacted]d  1. Prenatal care in second trimester *** - Glucose Tolerance, 1 HR (50g) w/o Fasting - CBC - RPR - HIV antibody (with reflex) - US OB Follow Up; Future  2. Pyelectasis of fetus on prenatal ultrasound ***  {Blank single:19197::"Preterm","Term"} labor symptoms and general obstetric precautions including but not limited to vaginal bleeding, contractions, leaking of fluid and fetal movement were reviewed in detail with the patient. Please refer to After Visit Summary for other counseling recommendations.  Return in about 2 weeks (around  02/26/2015).   Beaulah Dinning, MD

## 2015-02-12 NOTE — Progress Notes (Signed)
Breastfeeding tip of the week 28 wks labs with 1 hour gtt Pt declines Tdap until after delivery

## 2015-02-13 LAB — HIV ANTIBODY (ROUTINE TESTING W REFLEX): HIV 1&2 Ab, 4th Generation: NONREACTIVE

## 2015-02-13 LAB — GLUCOSE TOLERANCE, 1 HOUR (50G) W/O FASTING: Glucose, 1 Hour GTT: 76 mg/dL (ref 70–140)

## 2015-02-13 LAB — RPR

## 2015-02-16 ENCOUNTER — Inpatient Hospital Stay (HOSPITAL_COMMUNITY)
Admission: AD | Admit: 2015-02-16 | Discharge: 2015-02-16 | Disposition: A | Discharge status: Home / Self Care | Payer: No Typology Code available for payment source | Source: Ambulatory Visit | Attending: Obstetrics and Gynecology | Admitting: Obstetrics and Gynecology

## 2015-02-16 ENCOUNTER — Encounter (HOSPITAL_COMMUNITY): Payer: Self-pay | Admitting: *Deleted

## 2015-02-16 ENCOUNTER — Emergency Department (HOSPITAL_COMMUNITY)
Admission: EM | Admit: 2015-02-16 | Discharge: 2015-02-16 | Disposition: A | Discharge status: Home / Self Care | Payer: Medicaid Other | Attending: Emergency Medicine | Admitting: Emergency Medicine

## 2015-02-16 DIAGNOSIS — K047 Periapical abscess without sinus: Secondary | ICD-10-CM | POA: Diagnosis present

## 2015-02-16 DIAGNOSIS — K0889 Other specified disorders of teeth and supporting structures: Secondary | ICD-10-CM

## 2015-02-16 DIAGNOSIS — R220 Localized swelling, mass and lump, head: Secondary | ICD-10-CM | POA: Insufficient documentation

## 2015-02-16 DIAGNOSIS — O9989 Other specified diseases and conditions complicating pregnancy, childbirth and the puerperium: Secondary | ICD-10-CM | POA: Diagnosis not present

## 2015-02-16 DIAGNOSIS — K088 Other specified disorders of teeth and supporting structures: Secondary | ICD-10-CM | POA: Insufficient documentation

## 2015-02-16 DIAGNOSIS — Z720 Tobacco use: Secondary | ICD-10-CM | POA: Diagnosis not present

## 2015-02-16 DIAGNOSIS — G479 Sleep disorder, unspecified: Secondary | ICD-10-CM | POA: Insufficient documentation

## 2015-02-16 DIAGNOSIS — O99891 Other specified diseases and conditions complicating pregnancy: Secondary | ICD-10-CM

## 2015-02-16 DIAGNOSIS — Z3A28 28 weeks gestation of pregnancy: Secondary | ICD-10-CM | POA: Diagnosis not present

## 2015-02-16 DIAGNOSIS — Z8719 Personal history of other diseases of the digestive system: Secondary | ICD-10-CM | POA: Diagnosis not present

## 2015-02-16 MED ORDER — AMOXICILLIN 500 MG PO CAPS
500.0000 mg | ORAL_CAPSULE | Freq: Once | ORAL | Status: AC
  Administered 2015-02-16: 500 mg via ORAL
  Filled 2015-02-16: qty 1

## 2015-02-16 MED ORDER — AMOXICILLIN 500 MG PO CAPS: 500.0000 mg | ORAL_CAPSULE | Freq: Three times a day (TID) | ORAL | Status: DC

## 2015-02-16 NOTE — ED Provider Notes (Signed)
CSN: [id number]     Arrival date & time 02/16/15  1936 History  This chart was scribed for non-physician practitioner, Catha Gosselin, PA-C, working with Pricilla Loveless, MD, by Ronney Lion, ED Scribe. This patient was seen in room TR06C/TR06C and the patient's care was started at 8:16 PM.    Chief Complaint  Patient presents with  . Dental Problem   The history is provided by the patient. No language interpreter was used.    HPI Comments: Alison Mclean is a 19 y.o. female who is 7 months pregnant, who presents to the Emergency Department complaining of constant, severe upper dental pain. She also complains of associated facial swelling. Patient reports she had the same pain one year ago which was successfully resolved with amoxicillin, but she did not follow up with a dentist afterwards as she was pregnant and was worried she could not have certain dental treatments done. Patient has not taken any medication for this. She reports difficulty sleeping secondary to the pain, despite icing it periodically, which provided no relief.  She denies any fever.    Past Medical History  Diagnosis Date  . GERD (gastroesophageal reflux disease)   . Irritable bowel syndrome (IBS)   . Tobacco smoking affecting pregnancy in first trimester, antepartum 10/16/2014   Past Surgical History  Procedure Laterality Date  . Tonsillectomy     Family History  Problem Relation Age of Onset  . Hypertension Mother   . Diabetes Mother   . Hypertension Maternal Grandmother   . Diabetes Maternal Grandmother   . Cancer Maternal Grandfather     thyroid   History  Substance Use Topics  . Smoking status: Current Every Day Smoker -- 0.50 packs/day  . Smokeless tobacco: Never Used  . Alcohol Use: No   OB History    Gravida Para Term Preterm AB TAB SAB Ectopic Multiple Living   1              Review of Systems  HENT: Positive for dental problem and facial swelling.   Psychiatric/Behavioral: Positive for sleep  disturbance (secondary to pain).      Allergies  Review of patient's allergies indicates no known allergies.  Home Medications   Prior to Admission medications   Medication Sig Start Date End Date Taking? Authorizing Provider  amoxicillin (AMOXIL) 500 MG capsule Take 1 capsule (500 mg total) by mouth 3 (three) times daily. 02/16/15   Catha Gosselin, PA-C  Prenatal Vit-Min-FA-Fish Oil ([facility] PRENATAL GUMMY PO) Take by mouth.    Historical Provider, MD   BP 117/69 mmHg  Pulse 95  Temp(Src) 97.6 F (36.4 C) (Oral)  Resp 18  Ht 5\' 4"  (1.626 m)  Wt 181 lb (82.101 kg)  BMI 31.05 kg/m2  SpO2 100%  LMP 08/01/2014 (Exact Date)  Breastfeeding? Unknown Physical Exam  Constitutional: She is oriented to person, place, and time. She appears well-developed and well-nourished. No distress.  HENT:  Head: Normocephalic and atraumatic.  Mouth/Throat: Uvula is midline, oropharynx is clear and moist and mucous membranes are normal. No trismus in the jaw. Abnormal dentition. No dental abscesses or uvula swelling.  She has swelling of the right upper gums and face. No palpable fluctuance or abscess. No drooling. No trismus. No new tooth avulsion.  No anterior cervical edema.  Eyes: Conjunctivae and EOM are normal.  Neck: Neck supple. No tracheal deviation present.  Cardiovascular: Normal rate.   Pulmonary/Chest: Effort normal. No respiratory distress.  Musculoskeletal: Normal range of motion.  Neurological:  She is alert and oriented to person, place, and time.  Skin: Skin is warm and dry.  Psychiatric: She has a normal mood and affect. Her behavior is normal.  Nursing note and vitals reviewed.   ED Course  Procedures (including critical care time)  DIAGNOSTIC STUDIES: Oxygen Saturation is 95% on RA, normal by my interpretation.    COORDINATION OF CARE: 8:19 PM - Suspect dental infection. Discussed treatment plan with pt at bedside which includes Rx amoxicillin and referral to dentist, and  pt agreed to plan. MDM   Final diagnoses:  Pain, dental  No concerning signs of Ludwigs angina. I discussed return precautions. I also gave her dental referral. Medications  amoxicillin (AMOXIL) capsule 500 mg (500 mg Oral Given 02/16/15 2037)   I personally performed the services described in this documentation, which was scribed in my presence. The recorded information has been reviewed and is accurate.   Catha Gosselin, PA-C 02/17/15 0206  Pricilla Loveless, MD 02/25/15 [phone]

## 2015-02-16 NOTE — Discharge Instructions (Signed)
Dental Work and Pregnancy Proper dental care before, during, and after pregnancy is important for you and your baby. Pregnancy hormones can sometimes cause the gums to swell, which makes it easier for food to become trapped between teeth. The health of your teeth and gums can affect your growing baby.  DENTAL CARE RECOMMENDATIONS To help prevent infection and maintain healthy teeth and gums, a thorough oral examination is recommended for all women in the first trimester of pregnancy. Routine cleanings and examinations are recommended throughout pregnancy.  DENTAL CARE CONSIDERATIONS  Tell your dentist if you are pregnant or want to become pregnant.   If you are pregnant, routine X-ray exams should be avoided until after your baby is born. However, they do not need to be avoided if you are trying to become pregnant.   If you need an emergency procedure that includes a dental X-ray exam during pregnancy, very low levels of radiation will be emitted from the X-ray machines. Lead aprons can be used for protection of the chest, abdomen, and thyroid.  Your dentist will help you weigh the risks and benefits of dental procedures during pregnancy. If possible, it is best to have dental procedures (such as cavity fillings and crown repair) during the second trimester of pregnancy or after the baby is born.   If you and your dentist decide to postpone a procedure for any reason, your dentist can suggest treatment to reduce the chance of infection until the procedure is performed.  HOME CARE INSTRUCTIONS   Follow good oral hygiene habits at home.  Brush your teeth twice daily with fluoride toothpaste. Brush thoroughly for at least 2 minutes. Avoid strong flavored toothpastes if you have morning sickness.   Floss at least once daily.   Eat a well-balanced diet low in sugar and carbohydrates.   See your dentist for normal interval oral examinations and cleanings.   If you vomit, rinse your  mouth with water afterward.   Make a dental appointment if you experience oral problems.   Follow up with your dentist as directed.  SEEK DENTAL CARE IF:  Oral symptoms such as pain, bleeding, swelling, or inflammation develop or worsen.   You develop growths or swelling in between teeth.   You develop signs of infection, such as:   A fever of more than 100.5 F (38.1 C).   Chills. Document Released: 12/16/2009 Document Revised: 04/18/2013 Document Reviewed: 11/29/2012 [employer] Patient Information 2015 [employer]. This information is not intended to replace advice given to you by your health care provider. Make sure you discuss any questions you have with your health care provider.  

## 2015-02-16 NOTE — MAU Note (Signed)
Pt states she has had a toothache since last pm and was told to come here because we would know what antibiotics to use.

## 2015-02-16 NOTE — ED Notes (Signed)
The pt is c/o a toothache since last pm.  Swollen rt jaw today  lmp July 26th

## 2015-02-16 NOTE — MAU Provider Note (Signed)
Chief Complaint:  No chief complaint on file.   First Provider Initiated Contact with Patient 02/16/15 1912     HPI: Alison Mclean is a 19 y.o. G1P0 at [redacted]w[redacted]d who presents to maternity admissions reporting possible dental infection and pain in right molar. Rates pain 8/10, throbbing, constant. Has had ongoing problems in this area, but has not followed up with a dentist Denies contractions, leakage of fluid or vaginal bleeding. Good fetal movement.   Patient Active Problem List   Diagnosis Date Noted  . Pyelectasis of fetus on prenatal ultrasound   . Tobacco smoking affecting pregnancy in second trimester, antepartum 12/18/2014  . Marijuana use 11/03/2014  . Group B streptococcal bacteriuria 10/21/2014  . Supervision of normal pregnancy in first trimester 10/16/2014   Past Medical History: Past Medical History  Diagnosis Date  . GERD (gastroesophageal reflux disease)   . Irritable bowel syndrome (IBS)   . Tobacco smoking affecting pregnancy in first trimester, antepartum 10/16/2014    Past obstetric history: OB History  Gravida Para Term Preterm AB SAB TAB Ectopic Multiple Living  1             # Outcome Date GA Lbr Len/2nd Weight Sex Delivery Anes PTL Lv  1 Current               Past Surgical History: Past Surgical History  Procedure Laterality Date  . Tonsillectomy       Family History: Family History  Problem Relation Age of Onset  . Hypertension Mother   . Diabetes Mother   . Hypertension Maternal Grandmother   . Diabetes Maternal Grandmother   . Cancer Maternal Grandfather     thyroid    Social History: History  Substance Use Topics  . Smoking status: Current Every Day Smoker -- 0.50 packs/day  . Smokeless tobacco: Never Used  . Alcohol Use: No    Allergies: No Known Allergies  Meds:  Prescriptions prior to admission  Medication Sig Dispense Refill Last Dose  . Prenatal Vit-Min-FA-Fish Oil ([facility] PRENATAL GUMMY PO) Take by mouth.   Taking    ROS:   Review of Systems  Constitutional: Negative for fever and chills.  HENT:       Pos for toothache, swelling  And redness of right cheek  Genitourinary:       Neg for VB, LOF, contractions.    Physical Exam  Blood pressure 137/70, pulse 99, temperature 98.1 F (36.7 C), temperature source Oral, resp. rate 20, height  (1.626 m), weight 181 lb (82.101 kg), last menstrual period 08/01/2014, SpO2 98 %. GENERAL: Well-developed, well-nourished female in mild distress.  HEART: normal rate RESP: normal effort ABD: Gravid, S=D HEAD: Right cheek swollen, erythematous, tender.  NEURO: Alert and oriented x 4.   FHT:  158 by doppler   Labs: No results found for this or any previous visit (from the past 24 hour(s)).  Imaging:  No results found.  MAU Course: No Ob concerns. Explained that with swelling of check of significant pain, pt needs to be assessed in ED of by dentist for abscess. Pt is cleared obstetrically and is stable for D/C from MAU and F/U in private vehicle at ED.   Assessment: 1. Dental infection   2. Current maternal conditions classifiable elsewhere, antepartum     Plan: Discharge in stable condition. F/U at ED. Dental infection precautions.  Dental letter given.  Discussed importance of dental care in pregnancy and safety of most testing and Tx.  Preterm  labor precautions and fetal kick counts Follow-up Information    Follow up with [facility] ED Today.   Contact information:   [address] [place] [address]       Follow up with [facility].   Why:  As needed in Obstetric emergencies   Contact information:   [address] [id number] mc [place] [address] [phone]        Medication List    TAKE these medications        [facility] PRENATAL GUMMY PO  Take by mouth.        [place], CNM 02/16/2015 7:20 PM

## 2015-02-16 NOTE — Discharge Instructions (Signed)
Dental Pain Take anabiotic's as prescribed. Follow-up with a dentist. A tooth ache may be caused by cavities (tooth decay). Cavities expose the nerve of the tooth to air and hot or cold temperatures. It may come from an infection or abscess (also called a boil or furuncle) around your tooth. It is also often caused by dental caries (tooth decay). This causes the pain you are having. DIAGNOSIS  Your caregiver can diagnose this problem by exam. TREATMENT   If caused by an infection, it may be treated with medications which kill germs (antibiotics) and pain medications as prescribed by your caregiver. Take medications as directed.  Only take over-the-counter or prescription medicines for pain, discomfort, or fever as directed by your caregiver.  Whether the tooth ache today is caused by infection or dental disease, you should see your dentist as soon as possible for further care. SEEK MEDICAL CARE IF: The exam and treatment you received today has been provided on an emergency basis only. This is not a substitute for complete medical or dental care. If your problem worsens or new problems (symptoms) appear, and you are unable to meet with your dentist, call or return to this location. SEEK IMMEDIATE MEDICAL CARE IF:   You have a fever.  You develop redness and swelling of your face, jaw, or neck.  You are unable to open your mouth.  You have severe pain uncontrolled by pain medicine. MAKE SURE YOU:   Understand these instructions.  Will watch your condition.  Will get help right away if you are not doing well or get worse. Document Released: 06/28/2005 Document Revised: 09/20/2011 Document Reviewed: 02/14/2008 [facility] Patient Information 2015 [place], [place]. This information is not intended to replace advice given to you by your health care provider. Make sure you discuss any questions you have with your health care provider.

## 2015-02-26 ENCOUNTER — Ambulatory Visit (HOSPITAL_COMMUNITY)
Admission: RE | Admit: 2015-02-26 | Discharge: 2015-02-26 | Disposition: A | Discharge status: Home / Self Care | Payer: No Typology Code available for payment source | Source: Ambulatory Visit | Attending: Advanced Practice Midwife | Admitting: Advanced Practice Midwife

## 2015-02-26 ENCOUNTER — Other Ambulatory Visit: Payer: Self-pay | Admitting: Advanced Practice Midwife

## 2015-02-26 ENCOUNTER — Ambulatory Visit (INDEPENDENT_AMBULATORY_CARE_PROVIDER_SITE_OTHER): Payer: Medicaid Other | Admitting: Advanced Practice Midwife

## 2015-02-26 ENCOUNTER — Encounter: Payer: Self-pay | Admitting: Advanced Practice Midwife

## 2015-02-26 VITALS — BP 138/68 | HR 89 | Temp 97.6°F | Wt 185.0 lb

## 2015-02-26 DIAGNOSIS — IMO0002 Reserved for concepts with insufficient information to code with codable children: Secondary | ICD-10-CM

## 2015-02-26 DIAGNOSIS — O283 Abnormal ultrasonic finding on antenatal screening of mother: Secondary | ICD-10-CM | POA: Diagnosis not present

## 2015-02-26 DIAGNOSIS — Z3481 Encounter for supervision of other normal pregnancy, first trimester: Secondary | ICD-10-CM | POA: Diagnosis not present

## 2015-02-26 DIAGNOSIS — N898 Other specified noninflammatory disorders of vagina: Secondary | ICD-10-CM | POA: Diagnosis not present

## 2015-02-26 DIAGNOSIS — O26892 Other specified pregnancy related conditions, second trimester: Secondary | ICD-10-CM

## 2015-02-26 DIAGNOSIS — O35EXX0 Maternal care for other (suspected) fetal abnormality and damage, fetal genitourinary anomalies, not applicable or unspecified: Secondary | ICD-10-CM

## 2015-02-26 DIAGNOSIS — Z118 Encounter for screening for other infectious and parasitic diseases: Secondary | ICD-10-CM | POA: Diagnosis present

## 2015-02-26 DIAGNOSIS — Z36 Encounter for antenatal screening of mother: Secondary | ICD-10-CM | POA: Diagnosis present

## 2015-02-26 DIAGNOSIS — O358XX0 Maternal care for other (suspected) fetal abnormality and damage, not applicable or unspecified: Secondary | ICD-10-CM

## 2015-02-26 DIAGNOSIS — Z0489 Encounter for examination and observation for other specified reasons: Secondary | ICD-10-CM

## 2015-02-26 DIAGNOSIS — Z3491 Encounter for supervision of normal pregnancy, unspecified, first trimester: Secondary | ICD-10-CM

## 2015-02-26 DIAGNOSIS — Z3A29 29 weeks gestation of pregnancy: Secondary | ICD-10-CM | POA: Insufficient documentation

## 2015-02-26 DIAGNOSIS — Z3492 Encounter for supervision of normal pregnancy, unspecified, second trimester: Secondary | ICD-10-CM

## 2015-02-26 DIAGNOSIS — O133 Gestational [pregnancy-induced] hypertension without significant proteinuria, third trimester: Secondary | ICD-10-CM | POA: Diagnosis present

## 2015-02-26 LAB — POCT URINALYSIS DIP (DEVICE)
Bilirubin Urine: NEGATIVE
Glucose, UA: NEGATIVE mg/dL
HGB URINE DIPSTICK: NEGATIVE
Ketones, ur: NEGATIVE mg/dL
Nitrite: NEGATIVE
Protein, ur: NEGATIVE mg/dL
SPECIFIC GRAVITY, URINE: 1.02 (ref 1.005–1.030)
Urobilinogen, UA: 0.2 mg/dL (ref 0.0–1.0)
pH: 7 (ref 5.0–8.0)

## 2015-02-26 LAB — COMPREHENSIVE METABOLIC PANEL
ALBUMIN: 3.5 g/dL — AB (ref 3.6–5.1)
ALT: 14 U/L (ref 6–29)
AST: 15 U/L (ref 10–30)
Alkaline Phosphatase: 176 U/L — ABNORMAL HIGH (ref 33–115)
BUN: 10 mg/dL (ref 7–25)
CALCIUM: 8.9 mg/dL (ref 8.6–10.2)
CO2: 22 mmol/L (ref 20–31)
Chloride: 102 mmol/L (ref 98–110)
Creat: 0.61 mg/dL (ref 0.50–1.10)
Glucose, Bld: 78 mg/dL (ref 65–99)
POTASSIUM: 4 mmol/L (ref 3.5–5.3)
Sodium: 137 mmol/L (ref 135–146)
Total Bilirubin: 0.3 mg/dL (ref 0.2–1.2)
Total Protein: 5.8 g/dL — ABNORMAL LOW (ref 6.1–8.1)

## 2015-02-26 LAB — CBC
HEMATOCRIT: 34.8 % — AB (ref 36.0–46.0)
HEMOGLOBIN: 12.1 g/dL (ref 12.0–15.0)
MCH: 32.3 pg (ref 26.0–34.0)
MCHC: 34.8 g/dL (ref 30.0–36.0)
MCV: 92.8 fL (ref 78.0–100.0)
MPV: 9.1 fL (ref 8.6–12.4)
Platelets: 279 10*3/uL (ref 150–400)
RBC: 3.75 MIL/uL — ABNORMAL LOW (ref 3.87–5.11)
RDW: 13.1 % (ref 11.5–15.5)
WBC: 10.2 10*3/uL (ref 4.0–10.5)

## 2015-02-26 MED ORDER — METRONIDAZOLE 500 MG PO TABS: 500.0000 mg | ORAL_TABLET | Freq: Two times a day (BID) | ORAL | Status: DC

## 2015-02-26 NOTE — Progress Notes (Signed)
Subjective:  Alison Mclean is a 19 y.o. G1P0 at 34w6dbeing seen today for ongoing prenatal care.  Patient reports vaginal discharge. Reports foul odor; states it is similar to the discharge she had when previously diagnosed with bacterial vaginosis. Denies vaginal irritation.  Contractions: Not present.  Vag. Bleeding: None. Movement: Present. Denies leaking of fluid.  Denies headache/vision changes/epigastric pain.   The following portions of the patient's history were reviewed and updated as appropriate: allergies, current medications, past family history, past medical history, past social history, past surgical history and problem list.   Objective:   Filed Vitals:   02/26/15 0919 02/26/15 0921  BP: 143/78 138/68  Pulse: 90 89  Temp: 97.6 F (36.4 C)   Weight: 185 lb (83.915 kg)     Fetal Status: Fetal Heart Rate (bpm): 151   Movement: Present     General:  Alert, oriented and cooperative. Patient is in no acute distress.  Skin: Skin is warm and dry. No rash noted.   Cardiovascular: Normal heart rate noted  Respiratory: Normal respiratory effort, no problems with respiration noted  Abdomen: Soft, gravid, appropriate for gestational age. Pain/Pressure: Present     Pelvic: Vag. Bleeding: None Vag D/C Character: Mucous   Cervical exam deferred        Extremities: Normal range of motion.  Edema: Trace  Mental Status: Normal mood and affect. Normal behavior. Normal judgment and thought content.   Urinalysis: Urine Protein: Negative Urine Glucose: Negative  Assessment and Plan:  Pregnancy: G1P0 at 248w6d1. Vaginal discharge during pregnancy, second trimester -Rx flagyl -specimen collected for Affirm swab - Cervicovaginal ancillary only  2. Transient hypertension of pregnancy in third trimester  - CBC - Comp Met (CMET) - Protein / Creatinine Ratio, Urine  Preterm labor symptoms and general obstetric precautions including but not limited to vaginal bleeding,  contractions, leaking of fluid and fetal movement were reviewed in detail with the patient. Discussed s/s of pre eclampsia and reasons to go to MAU.  Please refer to After Visit Summary for other counseling recommendations.  Schedule f/u in 4 weeks for routine OB. Also schedule f/u ultrasound in 6 weeks.  Discussed tobacco cessation; states she is working on "cutting back" from 1/2 PPD.   ErJorje GuildNP

## 2015-02-26 NOTE — Patient Instructions (Signed)
Preterm Labor Information Preterm labor is when labor starts before you are [redacted] weeks pregnant. The normal length of pregnancy is 39 to 41 weeks.  CAUSES  The cause of preterm labor is not often known. The most common known cause is infection. RISK FACTORS  Having a history of preterm labor.  Having your water break before it should.  Having a placenta that covers the opening of the cervix.  Having a placenta that breaks away from the uterus.  Having a cervix that is too weak to hold the baby in the uterus.  Having too much fluid in the amniotic sac.  Taking drugs or smoking while pregnant.  Not gaining enough weight while pregnant.  Being younger than 43 and older than 20 years old.  Having a low income.  Being African American. SYMPTOMS  Period-like cramps, belly (abdominal) pain, or back pain.  Contractions that are regular, as often as six in an hour. They may be mild or painful.  Contractions that start at the top of the belly. They then move to the lower belly and back.  Lower belly pressure that seems to get stronger.  Bleeding from the vagina.  Fluid leaking from the vagina. TREATMENT  Treatment depends on:  Your condition.  The condition of your baby.  How many weeks pregnant you are. Your doctor may have you:  Take medicine to stop contractions.  Stay in bed except to use the restroom (bed rest).  Stay in the hospital. WHAT SHOULD YOU DO IF YOU THINK YOU ARE IN PRETERM LABOR? Call your doctor right away. You need to go to the hospital right away.  HOW CAN YOU PREVENT PRETERM LABOR IN FUTURE PREGNANCIES?  Stop smoking, if you smoke.  Maintain healthy weight gain.  Do not take drugs or be around chemicals that are not needed.  Tell your doctor if you think you have an infection.  Tell your doctor if you had a preterm labor before. Document Released: 09/24/2008 Document Revised: 04/18/2013 Document Reviewed: 09/24/2008 [facility] Patient  Information 2015 [place], [place]. This information is not intended to replace advice given to you by your health care provider. Make sure you discuss any questions you have with your health care provider.   Bacterial Vaginosis Bacterial vaginosis is an infection of the vagina. It happens when too many of certain germs (bacteria) grow in the vagina. HOME CARE  Take your medicine as told by your doctor.  Finish your medicine even if you start to feel better.  Do not have sex until you finish your medicine and are better.  Tell your sex partner that you have an infection. They should see their doctor for treatment.  Practice safe sex. Use condoms. Have only one sex partner. GET HELP IF:  You are not getting better after 3 days of treatment.  You have more grey fluid (discharge) coming from your vagina than before.  You have more pain than before.  You have a fever. MAKE SURE YOU:   Understand these instructions.  Will watch your condition.  Will get help right away if you are not doing well or get worse. Document Released: 04/06/2008 Document Revised: 04/18/2013 Document Reviewed: 02/07/2013 [facility] Patient Information 2015 [place], [place]. This information is not intended to replace advice given to you by your health care provider. Make sure you discuss any questions you have with your health care provider.

## 2015-02-26 NOTE — Progress Notes (Signed)
Breastfeeding tip of the week reviewed Pt states she has foul smelling discharge Leukocytes: trace

## 2015-02-27 LAB — PROTEIN / CREATININE RATIO, URINE
CREATININE, URINE: 118.7 mg/dL
Protein Creatinine Ratio: 0.14 (ref <0.15)
TOTAL PROTEIN, URINE: 17 mg/dL (ref 5–24)

## 2015-02-27 LAB — CERVICOVAGINAL ANCILLARY ONLY: Wet Prep (BD Affirm): POSITIVE — AB

## 2015-03-12 ENCOUNTER — Ambulatory Visit (INDEPENDENT_AMBULATORY_CARE_PROVIDER_SITE_OTHER): Payer: Medicaid Other | Admitting: Physician Assistant

## 2015-03-12 VITALS — BP 124/70 | HR 65 | Temp 98.3°F | Wt 189.1 lb

## 2015-03-12 DIAGNOSIS — O98813 Other maternal infectious and parasitic diseases complicating pregnancy, third trimester: Secondary | ICD-10-CM

## 2015-03-12 DIAGNOSIS — Z3491 Encounter for supervision of normal pregnancy, unspecified, first trimester: Secondary | ICD-10-CM

## 2015-03-12 DIAGNOSIS — B373 Candidiasis of vulva and vagina: Secondary | ICD-10-CM | POA: Diagnosis not present

## 2015-03-12 DIAGNOSIS — B3731 Acute candidiasis of vulva and vagina: Secondary | ICD-10-CM

## 2015-03-12 DIAGNOSIS — Z3481 Encounter for supervision of other normal pregnancy, first trimester: Secondary | ICD-10-CM | POA: Diagnosis present

## 2015-03-12 DIAGNOSIS — O35EXX0 Maternal care for other (suspected) fetal abnormality and damage, fetal genitourinary anomalies, not applicable or unspecified: Secondary | ICD-10-CM

## 2015-03-12 DIAGNOSIS — O283 Abnormal ultrasonic finding on antenatal screening of mother: Secondary | ICD-10-CM

## 2015-03-12 DIAGNOSIS — O358XX0 Maternal care for other (suspected) fetal abnormality and damage, not applicable or unspecified: Secondary | ICD-10-CM

## 2015-03-12 LAB — POCT URINALYSIS DIP (DEVICE)
BILIRUBIN URINE: NEGATIVE
Glucose, UA: NEGATIVE mg/dL
Hgb urine dipstick: NEGATIVE
Ketones, ur: NEGATIVE mg/dL
NITRITE: NEGATIVE
Protein, ur: NEGATIVE mg/dL
Specific Gravity, Urine: 1.02 (ref 1.005–1.030)
Urobilinogen, UA: 0.2 mg/dL (ref 0.0–1.0)
pH: 7.5 (ref 5.0–8.0)

## 2015-03-12 MED ORDER — TERCONAZOLE 0.4 % VA CREA
1.0000 | TOPICAL_CREAM | Freq: Every day | VAGINAL | Status: DC
Start: 2015-03-12 — End: 2015-03-26

## 2015-03-12 NOTE — Progress Notes (Signed)
Pain- "cramping"    

## 2015-03-12 NOTE — Patient Instructions (Signed)
Third Trimester of Pregnancy The third trimester is from week 29 through week 42, months 7 through 9. The third trimester is a time when the fetus is growing rapidly. At the end of the ninth month, the fetus is about 20 inches in length and weighs 6-10 pounds.  BODY CHANGES Your body goes through many changes during pregnancy. The changes vary from woman to woman.   Your weight will continue to increase. You can expect to gain 25-35 pounds (11-16 kg) by the end of the pregnancy.  You may begin to get stretch marks on your hips, abdomen, and breasts.  You may urinate more often because the fetus is moving lower into your pelvis and pressing on your bladder.  You may develop or continue to have heartburn as a result of your pregnancy.  You may develop constipation because certain hormones are causing the muscles that push waste through your intestines to slow down.  You may develop hemorrhoids or swollen, bulging veins (varicose veins).  You may have pelvic pain because of the weight gain and pregnancy hormones relaxing your joints between the bones in your pelvis. Backaches may result from overexertion of the muscles supporting your posture.  You may have changes in your hair. These can include thickening of your hair, rapid growth, and changes in texture. Some women also have hair loss during or after pregnancy, or hair that feels dry or thin. Your hair will most likely return to normal after your baby is born.  Your breasts will continue to grow and be tender. A yellow discharge may leak from your breasts called colostrum.  Your belly button may stick out.  You may feel short of breath because of your expanding uterus.  You may notice the fetus "dropping," or moving lower in your abdomen.  You may have a bloody mucus discharge. This usually occurs a few days to a week before labor begins.  Your cervix becomes thin and soft (effaced) near your due date. WHAT TO EXPECT AT YOUR PRENATAL  EXAMS  You will have prenatal exams every 2 weeks until week 36. Then, you will have weekly prenatal exams. During a routine prenatal visit:  You will be weighed to make sure you and the fetus are growing normally.  Your blood pressure is taken.  Your abdomen will be measured to track your baby's growth.  The fetal heartbeat will be listened to.  Any test results from the previous visit will be discussed.  You may have a cervical check near your due date to see if you have effaced. At around 36 weeks, your caregiver will check your cervix. At the same time, your caregiver will also perform a test on the secretions of the vaginal tissue. This test is to determine if a type of bacteria, Group B streptococcus, is present. Your caregiver will explain this further. Your caregiver may ask you:  What your birth plan is.  How you are feeling.  If you are feeling the baby move.  If you have had any abnormal symptoms, such as leaking fluid, bleeding, severe headaches, or abdominal cramping.  If you have any questions. Other tests or screenings that may be performed during your third trimester include:  Blood tests that check for low iron levels (anemia).  Fetal testing to check the health, activity level, and growth of the fetus. Testing is done if you have certain medical conditions or if there are problems during the pregnancy. FALSE LABOR You may feel small, irregular contractions that   eventually go away. These are called Braxton Hicks contractions, or false labor. Contractions may last for hours, days, or even weeks before true labor sets in. If contractions come at regular intervals, intensify, or become painful, it is best to be seen by your caregiver.  SIGNS OF LABOR   Menstrual-like cramps.  Contractions that are 5 minutes apart or less.  Contractions that start on the top of the uterus and spread down to the lower abdomen and back.  A sense of increased pelvic pressure or back  pain.  A watery or bloody mucus discharge that comes from the vagina. If you have any of these signs before the 37th week of pregnancy, call your caregiver right away. You need to go to the hospital to get checked immediately. HOME CARE INSTRUCTIONS   Avoid all smoking, herbs, alcohol, and unprescribed drugs. These chemicals affect the formation and growth of the baby.  Follow your caregiver's instructions regarding medicine use. There are medicines that are either safe or unsafe to take during pregnancy.  Exercise only as directed by your caregiver. Experiencing uterine cramps is a good sign to stop exercising.  Continue to eat regular, healthy meals.  Wear a good support bra for breast tenderness.  Do not use hot tubs, steam rooms, or saunas.  Wear your seat belt at all times when driving.  Avoid raw meat, uncooked cheese, cat litter boxes, and soil used by cats. These carry germs that can cause birth defects in the baby.  Take your prenatal vitamins.  Try taking a stool softener (if your caregiver approves) if you develop constipation. Eat more high-fiber foods, such as fresh vegetables or fruit and whole grains. Drink plenty of fluids to keep your urine clear or pale yellow.  Take warm sitz baths to soothe any pain or discomfort caused by hemorrhoids. Use hemorrhoid cream if your caregiver approves.  If you develop varicose veins, wear support hose. Elevate your feet for 15 minutes, 3-4 times a day. Limit salt in your diet.  Avoid heavy lifting, wear low heal shoes, and practice good posture.  Rest a lot with your legs elevated if you have leg cramps or low back pain.  Visit your dentist if you have not gone during your pregnancy. Use a soft toothbrush to brush your teeth and be gentle when you floss.  A sexual relationship may be continued unless your caregiver directs you otherwise.  Do not travel far distances unless it is absolutely necessary and only with the approval  of your caregiver.  Take prenatal classes to understand, practice, and ask questions about the labor and delivery.  Make a trial run to the hospital.  Pack your hospital bag.  Prepare the baby's nursery.  Continue to go to all your prenatal visits as directed by your caregiver. SEEK MEDICAL CARE IF:  You are unsure if you are in labor or if your water has broken.  You have dizziness.  You have mild pelvic cramps, pelvic pressure, or nagging pain in your abdominal area.  You have persistent nausea, vomiting, or diarrhea.  You have a bad smelling vaginal discharge.  You have pain with urination. SEEK IMMEDIATE MEDICAL CARE IF:   You have a fever.  You are leaking fluid from your vagina.  You have spotting or bleeding from your vagina.  You have severe abdominal cramping or pain.  You have rapid weight loss or gain.  You have shortness of breath with chest pain.  You notice sudden or extreme swelling   of your face, hands, ankles, feet, or legs.  You have not felt your baby move in over an hour.  You have severe headaches that do not go away with medicine.  You have vision changes. Document Released: 06/22/2001 Document Revised: 07/03/2013 Document Reviewed: 08/29/2012 Exit[employer] Patient Information 2015 [employer]. This information is not intended to replace advice given to you by your health care provider. Make sure you discuss any questions you have with your health care provider.  

## 2015-03-12 NOTE — Progress Notes (Signed)
Subjective:  Alison Mclean is a 19 y.o. G1P0 at [redacted]w[redacted]d being seen today for ongoing prenatal care.  Patient reports continued vaginal odor. She describes the odor as "bready". Denies vaginal discharge, dysuria, increased urinary frequency, or urgency. She was given Flagyl at last visit for concerns of BV, but BV results were negative. Contractions: Irritability.  Vag. Bleeding: None. Movement: Present. Denies leaking of fluid.   The following portions of the patient's history were reviewed and updated as appropriate: allergies, current medications, past family history, past medical history, past social history, past surgical history and problem list.   Objective:   Filed Vitals:   03/12/15 1036  BP: 124/70  Pulse: 65  Temp: 98.3 F (36.8 C)  Weight: 189 lb 1.6 oz (85.775 kg)    Fetal Status: Fetal Heart Rate (bpm): 152   Movement: Present     General:  Alert, oriented and cooperative. Patient is in no acute distress.  Skin: Skin is warm and dry. No rash noted.   Cardiovascular: Normal heart rate noted  Respiratory: Normal respiratory effort, no problems with respiration noted  Abdomen: Soft, gravid, appropriate for gestational age. Pain/Pressure: Present     Pelvic: Vag. Bleeding: None     Cervical exam deferred        Extremities: Normal range of motion.  Edema: None  Mental Status: Normal mood and affect. Normal behavior. Normal judgment and thought content.   Urinalysis: Urine Protein: Negative Urine Glucose: Negative  Assessment and Plan:  Pregnancy: G1P0 at [redacted]w[redacted]d  1. Supervision of normal pregnancy in first trimester  - Culture, OB Urine - Continue PNV   2. Vaginal Candidiasis  -Begin Terazol, instructed patient on how to apply medication  -Repeat UA due to previous positive large leukocytes in urine  3. Pyelectasis of fetus on prenatal ultrasound  -Patient will need follow up U/S on 9/28  -No signs of oligohydramnios   Preterm labor symptoms and general  obstetric precautions including but not limited to vaginal bleeding, contractions, leaking of fluid and fetal movement were reviewed in detail with the patient. Please refer to After Visit Summary for other counseling recommendations.  No Follow-up on file.    Bertram Denver, PA-C

## 2015-03-13 LAB — CULTURE, OB URINE: Colony Count: 15000

## 2015-03-14 ENCOUNTER — Telehealth: Payer: Self-pay | Admitting: *Deleted

## 2015-03-14 NOTE — Telephone Encounter (Signed)
Called pt and pt asked how was she suppose to take the terazol cream.  I explained to the pt that it states apply at bedtime; advised her to keep doing it until it is gone.  Pt then asked if she had more applicators.  I advised pt to call her pharmacy concerning the applicators.  Pt stated understanding with no further questions.

## 2015-03-14 NOTE — Telephone Encounter (Signed)
Received message left on nurse line on 03/13/15 at 1847.  Patient states she has a question about a prescription she was given yesterday.  Requests a return call.

## 2015-03-26 ENCOUNTER — Ambulatory Visit (INDEPENDENT_AMBULATORY_CARE_PROVIDER_SITE_OTHER): Payer: Medicaid Other | Admitting: Family Medicine

## 2015-03-26 VITALS — BP 107/65 | HR 77 | Temp 98.2°F | Wt 194.8 lb

## 2015-03-26 DIAGNOSIS — Z3491 Encounter for supervision of normal pregnancy, unspecified, first trimester: Secondary | ICD-10-CM

## 2015-03-26 DIAGNOSIS — Z23 Encounter for immunization: Secondary | ICD-10-CM

## 2015-03-26 DIAGNOSIS — O2343 Unspecified infection of urinary tract in pregnancy, third trimester: Secondary | ICD-10-CM

## 2015-03-26 DIAGNOSIS — O358XX0 Maternal care for other (suspected) fetal abnormality and damage, not applicable or unspecified: Secondary | ICD-10-CM

## 2015-03-26 DIAGNOSIS — O99331 Smoking (tobacco) complicating pregnancy, first trimester: Secondary | ICD-10-CM | POA: Diagnosis not present

## 2015-03-26 DIAGNOSIS — B951 Streptococcus, group B, as the cause of diseases classified elsewhere: Secondary | ICD-10-CM

## 2015-03-26 DIAGNOSIS — R8271 Bacteriuria: Secondary | ICD-10-CM

## 2015-03-26 DIAGNOSIS — Z3483 Encounter for supervision of other normal pregnancy, third trimester: Secondary | ICD-10-CM

## 2015-03-26 DIAGNOSIS — F17210 Nicotine dependence, cigarettes, uncomplicated: Secondary | ICD-10-CM | POA: Diagnosis not present

## 2015-03-26 DIAGNOSIS — O283 Abnormal ultrasonic finding on antenatal screening of mother: Secondary | ICD-10-CM | POA: Diagnosis not present

## 2015-03-26 DIAGNOSIS — O35EXX0 Maternal care for other (suspected) fetal abnormality and damage, fetal genitourinary anomalies, not applicable or unspecified: Secondary | ICD-10-CM

## 2015-03-26 LAB — POCT URINALYSIS DIP (DEVICE)
Bilirubin Urine: NEGATIVE
GLUCOSE, UA: NEGATIVE mg/dL
Hgb urine dipstick: NEGATIVE
Ketones, ur: NEGATIVE mg/dL
Leukocytes, UA: NEGATIVE
Nitrite: NEGATIVE
PROTEIN: NEGATIVE mg/dL
Specific Gravity, Urine: 1.015 (ref 1.005–1.030)
UROBILINOGEN UA: 0.2 mg/dL (ref 0.0–1.0)
pH: 7 (ref 5.0–8.0)

## 2015-03-26 MED ORDER — TETANUS-DIPHTH-ACELL PERTUSSIS 5-2.5-18.5 LF-MCG/0.5 IM SUSP
0.5000 mL | Freq: Once | INTRAMUSCULAR | Status: AC
  Administered 2015-03-26: 0.5 mL via INTRAMUSCULAR

## 2015-03-26 NOTE — Progress Notes (Signed)
Breastfeeding tip of the week reviewed Declines flu, will receive at her employer Tdap declined, patient prefers to wait until after baby is born

## 2015-03-26 NOTE — Progress Notes (Signed)
Subjective:  Alison Mclean is a 19 y.o. G1P0 at [redacted]w[redacted]d being seen today for ongoing prenatal care.  Patient reports fatigue.  Contractions: Not present.  Vag. Bleeding: None. Movement: Present. Denies leaking of fluid.   The following portions of the patient's history were reviewed and updated as appropriate: allergies, current medications, past family history, past medical history, past social history, past surgical history and problem list.   Objective:   Filed Vitals:   03/26/15 1050  BP: 107/65  Pulse: 77  Temp: 98.2 F (36.8 C)  Weight: 194 lb 12.8 oz (88.361 kg)    Fetal Status: Fetal Heart Rate (bpm): 142 Fundal Height: 35 cm Movement: Present     General:  Alert, oriented and cooperative. Patient is in no acute distress.  Skin: Skin is warm and dry. No rash noted.   Cardiovascular: Normal heart rate noted  Respiratory: Normal respiratory effort, no problems with respiration noted  Abdomen: Soft, gravid, appropriate for gestational age. Pain/Pressure: Present     Pelvic: Vag. Bleeding: None     Cervical exam deferred        Extremities: Normal range of motion.  Edema: Trace  Mental Status: Normal mood and affect. Normal behavior. Normal judgment and thought content.   Urinalysis: Urine Protein: Negative Urine Glucose: Negative  Assessment and Plan:  Pregnancy: G1P0 at [redacted]w[redacted]d  1. Supervision of normal pregnancy in first trimester -Updated pregnancy overview  2. Group B streptococcal bacteriuria -Amoxicillin completed -Will need PCN in labor, no need for GBS swab at 36 weeks  3. 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 Pyelectasis of fetus on prenatal ultrasound -Has f/u US scheduled  Preterm labor symptoms and general obstetric precautions including but not limited to vaginal bleeding, contractions, leaking of fluid and fetal movement were reviewed in detail with the patient. Please refer to  After Visit Summary for other counseling recommendations.  Return in about 2 weeks (around 04/09/2015) for Routine prenatal care.   Federico Flake, MD

## 2015-03-26 NOTE — Patient Instructions (Signed)
Third Trimester of Pregnancy The third trimester is from week 29 through week 42, months 7 through 9. The third trimester is a time when the fetus is growing rapidly. At the end of the ninth month, the fetus is about 20 inches in length and weighs 6-10 pounds.  BODY CHANGES Your body goes through many changes during pregnancy. The changes vary from woman to woman.   Your weight will continue to increase. You can expect to gain 25-35 pounds (11-16 kg) by the end of the pregnancy.  You may begin to get stretch marks on your hips, abdomen, and breasts.  You may urinate more often because the fetus is moving lower into your pelvis and pressing on your bladder.  You may develop or continue to have heartburn as a result of your pregnancy.  You may develop constipation because certain hormones are causing the muscles that push waste through your intestines to slow down.  You may develop hemorrhoids or swollen, bulging veins (varicose veins).  You may have pelvic pain because of the weight gain and pregnancy hormones relaxing your joints between the bones in your pelvis. Backaches may result from overexertion of the muscles supporting your posture.  You may have changes in your hair. These can include thickening of your hair, rapid growth, and changes in texture. Some women also have hair loss during or after pregnancy, or hair that feels dry or thin. Your hair will most likely return to normal after your baby is born.  Your breasts will continue to grow and be tender. A yellow discharge may leak from your breasts called colostrum.  Your belly button may stick out.  You may feel short of breath because of your expanding uterus.  You may notice the fetus "dropping," or moving lower in your abdomen.  You may have a bloody mucus discharge. This usually occurs a few days to a week before labor begins.  Your cervix becomes thin and soft (effaced) near your due date. WHAT TO EXPECT AT YOUR PRENATAL  EXAMS  You will have prenatal exams every 2 weeks until week 36. Then, you will have weekly prenatal exams. During a routine prenatal visit:  You will be weighed to make sure you and the fetus are growing normally.  Your blood pressure is taken.  Your abdomen will be measured to track your baby's growth.  The fetal heartbeat will be listened to.  Any test results from the previous visit will be discussed.  You may have a cervical check near your due date to see if you have effaced. At around 36 weeks, your caregiver will check your cervix. At the same time, your caregiver will also perform a test on the secretions of the vaginal tissue. This test is to determine if a type of bacteria, Group B streptococcus, is present. Your caregiver will explain this further. Your caregiver may ask you:  What your birth plan is.  How you are feeling.  If you are feeling the baby move.  If you have had any abnormal symptoms, such as leaking fluid, bleeding, severe headaches, or abdominal cramping.  If you have any questions. Other tests or screenings that may be performed during your third trimester include:  Blood tests that check for low iron levels (anemia).  Fetal testing to check the health, activity level, and growth of the fetus. Testing is done if you have certain medical conditions or if there are problems during the pregnancy. FALSE LABOR You may feel small, irregular contractions that   eventually go away. These are called Braxton Hicks contractions, or false labor. Contractions may last for hours, days, or even weeks before true labor sets in. If contractions come at regular intervals, intensify, or become painful, it is best to be seen by your caregiver.  SIGNS OF LABOR   Menstrual-like cramps.  Contractions that are 5 minutes apart or less.  Contractions that start on the top of the uterus and spread down to the lower abdomen and back.  A sense of increased pelvic pressure or back  pain.  A watery or bloody mucus discharge that comes from the vagina. If you have any of these signs before the 37th week of pregnancy, call your caregiver right away. You need to go to the hospital to get checked immediately. HOME CARE INSTRUCTIONS   Avoid all smoking, herbs, alcohol, and unprescribed drugs. These chemicals affect the formation and growth of the baby.  Follow your caregiver's instructions regarding medicine use. There are medicines that are either safe or unsafe to take during pregnancy.  Exercise only as directed by your caregiver. Experiencing uterine cramps is a good sign to stop exercising.  Continue to eat regular, healthy meals.  Wear a good support bra for breast tenderness.  Do not use hot tubs, steam rooms, or saunas.  Wear your seat belt at all times when driving.  Avoid raw meat, uncooked cheese, cat litter boxes, and soil used by cats. These carry germs that can cause birth defects in the baby.  Take your prenatal vitamins.  Try taking a stool softener (if your caregiver approves) if you develop constipation. Eat more high-fiber foods, such as fresh vegetables or fruit and whole grains. Drink plenty of fluids to keep your urine clear or pale yellow.  Take warm sitz baths to soothe any pain or discomfort caused by hemorrhoids. Use hemorrhoid cream if your caregiver approves.  If you develop varicose veins, wear support hose. Elevate your feet for 15 minutes, 3-4 times a day. Limit salt in your diet.  Avoid heavy lifting, wear low heal shoes, and practice good posture.  Rest a lot with your legs elevated if you have leg cramps or low back pain.  Visit your dentist if you have not gone during your pregnancy. Use a soft toothbrush to brush your teeth and be gentle when you floss.  A sexual relationship may be continued unless your caregiver directs you otherwise.  Do not travel far distances unless it is absolutely necessary and only with the approval  of your caregiver.  Take prenatal classes to understand, practice, and ask questions about the labor and delivery.  Make a trial run to the hospital.  Pack your hospital bag.  Prepare the baby's nursery.  Continue to go to all your prenatal visits as directed by your caregiver. SEEK MEDICAL CARE IF:  You are unsure if you are in labor or if your water has broken.  You have dizziness.  You have mild pelvic cramps, pelvic pressure, or nagging pain in your abdominal area.  You have persistent nausea, vomiting, or diarrhea.  You have a bad smelling vaginal discharge.  You have pain with urination. SEEK IMMEDIATE MEDICAL CARE IF:   You have a fever.  You are leaking fluid from your vagina.  You have spotting or bleeding from your vagina.  You have severe abdominal cramping or pain.  You have rapid weight loss or gain.  You have shortness of breath with chest pain.  You notice sudden or extreme swelling   of your face, hands, ankles, feet, or legs.  You have not felt your baby move in over an hour.  You have severe headaches that do not go away with medicine.  You have vision changes. Document Released: 06/22/2001 Document Revised: 07/03/2013 Document Reviewed: 08/29/2012 Exit[employer] Patient Information 2015 [employer]. This information is not intended to replace advice given to you by your health care provider. Make sure you discuss any questions you have with your health care provider.  

## 2015-04-09 ENCOUNTER — Ambulatory Visit (HOSPITAL_COMMUNITY)
Admission: RE | Admit: 2015-04-09 | Discharge: 2015-04-09 | Disposition: A | Discharge status: Home / Self Care | Payer: Medicaid Other | Source: Ambulatory Visit | Attending: Physician Assistant | Admitting: Physician Assistant

## 2015-04-09 ENCOUNTER — Other Ambulatory Visit: Payer: Self-pay | Admitting: General Practice

## 2015-04-09 ENCOUNTER — Ambulatory Visit (INDEPENDENT_AMBULATORY_CARE_PROVIDER_SITE_OTHER): Payer: No Typology Code available for payment source | Admitting: Advanced Practice Midwife

## 2015-04-09 VITALS — BP 134/76 | HR 67 | Temp 97.9°F | Wt 200.4 lb

## 2015-04-09 DIAGNOSIS — Z23 Encounter for immunization: Secondary | ICD-10-CM

## 2015-04-09 DIAGNOSIS — O283 Abnormal ultrasonic finding on antenatal screening of mother: Secondary | ICD-10-CM

## 2015-04-09 DIAGNOSIS — Z3481 Encounter for supervision of other normal pregnancy, first trimester: Secondary | ICD-10-CM | POA: Diagnosis present

## 2015-04-09 DIAGNOSIS — O358XX0 Maternal care for other (suspected) fetal abnormality and damage, not applicable or unspecified: Secondary | ICD-10-CM

## 2015-04-09 DIAGNOSIS — Z3491 Encounter for supervision of normal pregnancy, unspecified, first trimester: Secondary | ICD-10-CM

## 2015-04-09 DIAGNOSIS — O2343 Unspecified infection of urinary tract in pregnancy, third trimester: Secondary | ICD-10-CM

## 2015-04-09 DIAGNOSIS — R8271 Bacteriuria: Secondary | ICD-10-CM

## 2015-04-09 DIAGNOSIS — Z3A35 35 weeks gestation of pregnancy: Secondary | ICD-10-CM

## 2015-04-09 DIAGNOSIS — B951 Streptococcus, group B, as the cause of diseases classified elsewhere: Secondary | ICD-10-CM | POA: Diagnosis not present

## 2015-04-09 DIAGNOSIS — O35EXX0 Maternal care for other (suspected) fetal abnormality and damage, fetal genitourinary anomalies, not applicable or unspecified: Secondary | ICD-10-CM

## 2015-04-09 NOTE — Progress Notes (Signed)
Breastfeeding tip of the week reviewed Flu vaccine today 

## 2015-04-09 NOTE — Patient Instructions (Signed)
Braxton Hicks Contractions °Contractions of the uterus can occur throughout pregnancy. Contractions are not always a sign that you are in labor.  °WHAT ARE BRAXTON HICKS CONTRACTIONS?  °Contractions that occur before labor are called Braxton Hicks contractions, or false labor. Toward the end of pregnancy (32-34 weeks), these contractions can develop more often and may become more forceful. This is not true labor because these contractions do not result in opening (dilatation) and thinning of the cervix. They are sometimes difficult to tell apart from true labor because these contractions can be forceful and people have different pain tolerances. You should not feel embarrassed if you go to the hospital with false labor. Sometimes, the only way to tell if you are in true labor is for your health care provider to look for changes in the cervix. °If there are no prenatal problems or other health problems associated with the pregnancy, it is completely safe to be sent home with false labor and await the onset of true labor. °HOW CAN YOU TELL THE DIFFERENCE BETWEEN TRUE AND FALSE LABOR? °False Labor °· The contractions of false labor are usually shorter and not as hard as those of true labor.   °· The contractions are usually irregular.   °· The contractions are often felt in the front of the lower abdomen and in the groin.   °· The contractions may go away when you walk around or change positions while lying down.   °· The contractions get weaker and are shorter lasting as time goes on.   °· The contractions do not usually become progressively stronger, regular, and closer together as with true labor.   °True Labor °· Contractions in true labor last 30-70 seconds, become very regular, usually become more intense, and increase in frequency.   °· The contractions do not go away with walking.   °· The discomfort is usually felt in the top of the uterus and spreads to the lower abdomen and low back.   °· True labor can be  determined by your health care provider with an exam. This will show that the cervix is dilating and getting thinner.   °WHAT TO REMEMBER °· Keep up with your usual exercises and follow other instructions given by your health care provider.   °· Take medicines as directed by your health care provider.   °· Keep your regular prenatal appointments.   °· Eat and drink lightly if you think you are going into labor.   °· If Braxton Hicks contractions are making you uncomfortable:   °¨ Change your position from lying down or resting to walking, or from walking to resting.   °¨ Sit and rest in a tub of warm water.   °¨ Drink 2-3 glasses of water. Dehydration may cause these contractions.   °¨ Do slow and deep breathing several times an hour.   °WHEN SHOULD I SEEK IMMEDIATE MEDICAL CARE? °Seek immediate medical care if: °· Your contractions become stronger, more regular, and closer together.   °· You have fluid leaking or gushing from your vagina.   °· You have a fever.   °· You pass blood-tinged mucus.   °· You have vaginal bleeding.   °· You have continuous abdominal pain.   °· You have low back pain that you never had before.   °· You feel your baby's head pushing down and causing pelvic pressure.   °· Your baby is not moving as much as it used to.   °Document Released: 06/28/2005 Document Revised: 07/03/2013 Document Reviewed: 04/09/2013 °[employer]® Patient Information ©2015 [employer]. This information is not intended to replace advice given to you by your health care   provider. Make sure you discuss any questions you have with your health care provider. ° °

## 2015-04-09 NOTE — Progress Notes (Signed)
Subjective:  Atiya R [place] is a 19 y.o. G1P0 at [redacted]w[redacted]d being seen today for ongoing prenatal care.  Patient reports occasional contractions and groin pains.  Contractions: Not present.  Vag. Bleeding: None. Movement: Present. Denies leaking of fluid.   Had [place] in MFM prior to appt, but results not back.   The following portions of the patient's history were reviewed and updated as appropriate: allergies, current medications, past family history, past medical history, past social history, past surgical history and problem list.   Objective:   Filed Vitals:   04/09/15 1049  BP: 134/76  Pulse: 67  Temp: 97.9 F (36.6 C)  Weight: 200 lb 6.4 oz (90.901 kg)    Fetal Status: Fetal Heart Rate (bpm): 134   Movement: Present     General:  Alert, oriented and cooperative. Patient is in no acute distress.  Skin: Skin is warm and dry. No rash noted.   Cardiovascular: Normal heart rate noted  Respiratory: Normal respiratory effort, no problems with respiration noted  Abdomen: Soft, gravid, appropriate for gestational age. Pain/Pressure: Present     Pelvic: Vag. Bleeding: None     Cervical exam deferred        Extremities: Normal range of motion.  Edema: Trace  Mental Status: Normal mood and affect. Normal behavior. Normal judgment and thought content.   Urinalysis: Urine Protein: Negative Urine Glucose: Negative   GBS pos urine   Assessment and Plan:  Pregnancy: G1P0 at [redacted]w[redacted]d  1. Needs flu shot  - Flu Vaccine QUAD 36+ mos IM; Standing - Flu Vaccine QUAD 36+ mos IM   2. Supervision of normal pregnancy in first trimester  GC/Chlamydia cultures  Preterm labor symptoms and general obstetric precautions including but not limited to vaginal bleeding, contractions, leaking of fluid and fetal movement were reviewed in detail with the patient. Please refer to After Visit Summary for other counseling recommendations.  Return in about 1 week (around 04/16/2015).   Dorathy Kinsman, CNM

## 2015-04-10 ENCOUNTER — Telehealth: Payer: Self-pay | Admitting: General Practice

## 2015-04-10 NOTE — Telephone Encounter (Signed)
Per Alison Mclean, patient's recent ultrasound shows polyhydramnios and patient needs to start twice weekly testing this week on Friday. Patient also needs to do fetal kick counts. Patient called into front office to set up appts. I discussed need for testing with patient and frequency of visits. Also discussed she should closely monitor baby's movements and if she is not feeling movement she should let us know. Patient verbalizes understanding to all. Transferred call back to [name] to schedule appts. Patient had no questions

## 2015-04-15 ENCOUNTER — Ambulatory Visit (INDEPENDENT_AMBULATORY_CARE_PROVIDER_SITE_OTHER): Payer: Medicaid Other | Admitting: Certified Nurse Midwife

## 2015-04-15 ENCOUNTER — Other Ambulatory Visit: Payer: Self-pay | Admitting: Certified Nurse Midwife

## 2015-04-15 ENCOUNTER — Encounter (HOSPITAL_COMMUNITY): Payer: Self-pay

## 2015-04-15 ENCOUNTER — Ambulatory Visit (HOSPITAL_COMMUNITY)
Admission: RE | Admit: 2015-04-15 | Discharge: 2015-04-15 | Disposition: A | Discharge status: Home / Self Care | Payer: No Typology Code available for payment source | Source: Ambulatory Visit | Attending: Certified Nurse Midwife | Admitting: Certified Nurse Midwife

## 2015-04-15 VITALS — BP 131/73 | HR 92 | Wt 202.9 lb

## 2015-04-15 DIAGNOSIS — Z3A36 36 weeks gestation of pregnancy: Secondary | ICD-10-CM | POA: Diagnosis not present

## 2015-04-15 DIAGNOSIS — Z113 Encounter for screening for infections with a predominantly sexual mode of transmission: Secondary | ICD-10-CM

## 2015-04-15 DIAGNOSIS — O358XX0 Maternal care for other (suspected) fetal abnormality and damage, not applicable or unspecified: Secondary | ICD-10-CM | POA: Diagnosis not present

## 2015-04-15 DIAGNOSIS — O403XX0 Polyhydramnios, third trimester, not applicable or unspecified: Secondary | ICD-10-CM | POA: Insufficient documentation

## 2015-04-15 DIAGNOSIS — O35EXX0 Maternal care for other (suspected) fetal abnormality and damage, fetal genitourinary anomalies, not applicable or unspecified: Secondary | ICD-10-CM

## 2015-04-15 LAB — POCT URINALYSIS DIP (DEVICE)
Bilirubin Urine: NEGATIVE
Glucose, UA: NEGATIVE mg/dL
Hgb urine dipstick: NEGATIVE
Ketones, ur: NEGATIVE mg/dL
Nitrite: NEGATIVE
Protein, ur: NEGATIVE mg/dL
Specific Gravity, Urine: 1.02 (ref 1.005–1.030)
Urobilinogen, UA: 0.2 mg/dL (ref 0.0–1.0)
pH: 7 (ref 5.0–8.0)

## 2015-04-15 NOTE — Progress Notes (Signed)
Subjective:  Alison Mclean is a 19 y.o. G1P0 at [redacted]w[redacted]d being seen today for ongoing prenatal care.  Patient reports occasional contractions.  Contractions: Not present.  Vag. Bleeding: None. Movement: Present. Denies leaking of fluid.   The following portions of the patient's history were reviewed and updated as appropriate: allergies, current medications, past family history, past medical history, past social history, past surgical history and problem list.   Objective:   Filed Vitals:   04/15/15 0820  BP: 131/73  Pulse: 92  Weight: 202 lb 14.4 oz (92.035 kg)    Fetal Status: Fetal Heart Rate (bpm): NST   Movement: Present   reactive  General:  Alert, oriented and cooperative. Patient is in no acute distress.  Skin: Skin is warm and dry. No rash noted.   Cardiovascular: Normal heart rate noted  Respiratory: Normal respiratory effort, no problems with respiration noted  Abdomen: Soft, gravid, appropriate for gestational age. Pain/Pressure: Present     Pelvic: Vag. Bleeding: None     Cervical exam deferred        Extremities: Normal range of motion.  Edema: Trace  Mental Status: Normal mood and affect. Normal behavior. Normal judgment and thought content.   Urinalysis:      Assessment and Plan:  Pregnancy: G1P0 at [redacted]w[redacted]d  1. Screening for STD (sexually transmitted disease)  - Urine cytology ancillary only  2. Polyhydramnios, third trimester, not applicable or unspecified fetus Sent for AFI today Biweekly Testing - Fetal nonstress test - [place] MFM OB LIMITED; Future  Preterm labor symptoms and general obstetric precautions including but not limited to vaginal bleeding, contractions, leaking of fluid and fetal movement were reviewed in detail with the patient. Please refer to After Visit Summary for other counseling recommendations.  No Follow-up on file. Friday - Nst  Rhea Pink, CNM

## 2015-04-15 NOTE — Patient Instructions (Signed)
Polyhydramnios °When a woman becomes pregnant, a sac is formed around the fertilized egg (embryo) and later the growing baby (fetus). This sac is called the amniotic sac. The amniotic sac is filled with fluid. It gets bigger as the pregnancy grows. When there is too much fluid in the sac, it is called polyhydramnios. All babies born with polyhydramnios should be looked at for congenital abnormalities. °The amniotic fluid cushions and protects the baby. It also provides the baby with fluids and is crucial to normal development. Your baby breathes this fluid into its lungs and swallows it. This helps promote the healthy growth of the lungs and gastrointestinal tract. Amniotic fluid also helps the baby move around, helping with the normal development of muscle and bone.  °CAUSES  °· Diabetes mellitus. °· Downs Syndrome, fetal abnormalities of the intestinal tract and anencephaly (the fetus has no brain) can prevent the fetus from swallowing the amniotic fluid. °· One twins passes (transfuses) their blood into the other twin (twin-twin transfusion syndrome). °· Medical illness of the mother or heart. °· Kidney disease. °· Tumor (chorioangioma) of the placenta. °SYMPTOMS  °· The uterus enlarges beyond the size it should be for that particular time of the pregnancy. °· The mother may feel more pressure and discomfort than should be expected. °· The mother may notice a quick and unexpected enlargement of her stomach. °DIAGNOSIS  °· Your health care provider notices your uterus is beyond the size that is consistent with the time of the pregnancy when he or she measures you. °· An ultrasound is then used (abdominally or vaginally) to see if there are twins or more, measure the growth of the baby, looks for birth defects and measures the amount of fluid in the amniotic sac. °· Amniotic Fluid Index (AFI). AFI measures the amount of fluid in the amniotic sac in four different areas. If there is more than 24 centimeters, you  have polyhydramnios. °TREATMENT  °· Removing some fluid from the amniotic sac. °· Take medications that lower the fluids in your body. °· Stop using salt or salty foods because it causes you to keep fluid in your body (retention). °· If your health care provider thinks you have polyhydramnios, you will likely need more testing. You will be watched for the rest of your pregnancy. °HOME CARE INSTRUCTIONS  °· Keep all your prenatal appointments. Follow your health care provider's recommendations. °· Do not eat a lot of salt and salty foods. °· If you have diabetes, keep in it control. °· If you have heart or kidney disease, treat the disease as advised by your health care provider. °SEEK MEDICAL CARE IF:  °· You think your uterus has grown too fast in a short period of time. °· You feel a great amount of pressure in your lower belly (pelvis) and are more uncomfortable than expected. °SEEK IMMEDIATE MEDICAL CARE IF:  °· You have a gush of fluid or are leaking fluid from your vagina. °· You stop feeling the baby move. °· You do not feel the baby kicking as much as usual. °· You have a hard time keeping your diabetes under control. °· You are having problems with your heart or kidney disease. °Document Released: 09/18/2002 Document Revised: 04/18/2013 Document Reviewed: 01/25/2013 °ExitCare® Patient Information ©2015 [employer]. This information is not intended to replace advice given to you by your health care provider. Make sure you discuss any questions you have with your health care provider. ° °

## 2015-04-15 NOTE — Progress Notes (Signed)
OBF/NST Breastfeeding tip of the week reviewed

## 2015-04-16 ENCOUNTER — Encounter: Payer: No Typology Code available for payment source | Admitting: Certified Nurse Midwife

## 2015-04-16 LAB — URINE CYTOLOGY ANCILLARY ONLY
Chlamydia: NEGATIVE
Neisseria Gonorrhea: NEGATIVE

## 2015-04-18 ENCOUNTER — Ambulatory Visit (INDEPENDENT_AMBULATORY_CARE_PROVIDER_SITE_OTHER): Payer: Medicaid Other | Admitting: General Practice

## 2015-04-18 VITALS — BP 112/59 | HR 80

## 2015-04-18 DIAGNOSIS — O403XX1 Polyhydramnios, third trimester, fetus 1: Secondary | ICD-10-CM

## 2015-04-18 NOTE — Progress Notes (Signed)
NST performed today was reviewed and was found to be reactive.  Continue recommended antenatal testing and prenatal care.  Shaquila Sigman, MD, FACOG Attending Obstetrician & Gynecologist, [facility]  

## 2015-04-22 ENCOUNTER — Other Ambulatory Visit: Payer: No Typology Code available for payment source

## 2015-04-22 ENCOUNTER — Ambulatory Visit (INDEPENDENT_AMBULATORY_CARE_PROVIDER_SITE_OTHER): Payer: Medicaid Other | Admitting: Obstetrics and Gynecology

## 2015-04-22 VITALS — BP 122/78 | HR 95 | Wt 206.7 lb

## 2015-04-22 DIAGNOSIS — O283 Abnormal ultrasonic finding on antenatal screening of mother: Secondary | ICD-10-CM

## 2015-04-22 DIAGNOSIS — O403XX1 Polyhydramnios, third trimester, fetus 1: Secondary | ICD-10-CM

## 2015-04-22 DIAGNOSIS — O403XX0 Polyhydramnios, third trimester, not applicable or unspecified: Secondary | ICD-10-CM

## 2015-04-22 DIAGNOSIS — O358XX0 Maternal care for other (suspected) fetal abnormality and damage, not applicable or unspecified: Secondary | ICD-10-CM

## 2015-04-22 DIAGNOSIS — O35EXX0 Maternal care for other (suspected) fetal abnormality and damage, fetal genitourinary anomalies, not applicable or unspecified: Secondary | ICD-10-CM

## 2015-04-22 DIAGNOSIS — Z3491 Encounter for supervision of normal pregnancy, unspecified, first trimester: Secondary | ICD-10-CM

## 2015-04-22 LAB — POCT URINALYSIS DIP (DEVICE)
Bilirubin Urine: NEGATIVE
GLUCOSE, UA: NEGATIVE mg/dL
HGB URINE DIPSTICK: NEGATIVE
Ketones, ur: NEGATIVE mg/dL
NITRITE: NEGATIVE
Protein, ur: NEGATIVE mg/dL
Specific Gravity, Urine: 1.025 (ref 1.005–1.030)
Urobilinogen, UA: 0.2 mg/dL (ref 0.0–1.0)
pH: 6 (ref 5.0–8.0)

## 2015-04-22 NOTE — Progress Notes (Signed)
Breastfeeding tip of the week reviewed.  IOL scheduled 10/20 @ 0700.

## 2015-04-22 NOTE — Progress Notes (Signed)
Subjective:  Alison Mclean is a 19 y.o. G1P0 at [redacted]w[redacted]d being seen today for ongoing prenatal care.  Patient reports no complaints.  Contractions: Irregular.  Vag. Bleeding: None. Movement: Present. Denies leaking of fluid.   The following portions of the patient's history were reviewed and updated as appropriate: allergies, current medications, past family history, past medical history, past social history, past surgical history and problem list.   Objective:   Filed Vitals:   04/22/15 0826  BP: 122/78  Pulse: 95  Weight: 206 lb 11.2 oz (93.759 kg)    Fetal Status: Fetal Heart Rate (bpm): NST   Movement: Present  Presentation: Vertex  General:  Alert, oriented and cooperative. Patient is in no acute distress.  Skin: Skin is warm and dry. No rash noted.   Cardiovascular: Normal heart rate noted  Respiratory: Normal respiratory effort, no problems with respiration noted  Abdomen: Soft, gravid, appropriate for gestational age. Pain/Pressure: Present     Pelvic: Vag. Bleeding: None     Cervical exam deferred        Extremities: Normal range of motion.  Edema: None  Mental Status: Normal mood and affect. Normal behavior. Normal judgment and thought content.   Urinalysis: Urine Protein: Negative Urine Glucose: Negative  Assessment and Plan:  Pregnancy: G1P0 at [redacted]w[redacted]d  1. Polyhydramnios, third trimester, fetus 1 - Amniotic fluid index 24, with NST reactive - iol scheduled for 10/20 when will be 39+0    Term labor symptoms and general obstetric precautions including but not limited to vaginal bleeding, contractions, leaking of fluid and fetal movement were reviewed in detail with the patient. Please refer to After Visit Summary for other counseling recommendations.  Return in about 3 days (around 04/25/2015) for 2x/wk as scheduled.   Kathrynn Running, MD

## 2015-04-23 ENCOUNTER — Encounter: Payer: No Typology Code available for payment source | Admitting: Family

## 2015-04-24 ENCOUNTER — Inpatient Hospital Stay (HOSPITAL_COMMUNITY)
Admission: AD | Admit: 2015-04-24 | Discharge: 2015-04-27 | DRG: 774 | Disposition: A | Discharge status: Home / Self Care | Payer: Medicaid Other | Source: Ambulatory Visit | Attending: Obstetrics & Gynecology | Admitting: Obstetrics & Gynecology

## 2015-04-24 ENCOUNTER — Encounter (HOSPITAL_COMMUNITY): Payer: Self-pay | Admitting: *Deleted

## 2015-04-24 DIAGNOSIS — O4290 Premature rupture of membranes, unspecified as to length of time between rupture and onset of labor, unspecified weeks of gestation: Secondary | ICD-10-CM | POA: Diagnosis present

## 2015-04-24 DIAGNOSIS — K589 Irritable bowel syndrome without diarrhea: Secondary | ICD-10-CM | POA: Diagnosis present

## 2015-04-24 DIAGNOSIS — O133 Gestational [pregnancy-induced] hypertension without significant proteinuria, third trimester: Secondary | ICD-10-CM | POA: Diagnosis present

## 2015-04-24 DIAGNOSIS — Z833 Family history of diabetes mellitus: Secondary | ICD-10-CM

## 2015-04-24 DIAGNOSIS — K219 Gastro-esophageal reflux disease without esophagitis: Secondary | ICD-10-CM | POA: Diagnosis present

## 2015-04-24 DIAGNOSIS — O99824 Streptococcus B carrier state complicating childbirth: Secondary | ICD-10-CM | POA: Diagnosis present

## 2015-04-24 DIAGNOSIS — R8271 Bacteriuria: Secondary | ICD-10-CM | POA: Diagnosis present

## 2015-04-24 DIAGNOSIS — Z3A38 38 weeks gestation of pregnancy: Secondary | ICD-10-CM

## 2015-04-24 DIAGNOSIS — O4292 Full-term premature rupture of membranes, unspecified as to length of time between rupture and onset of labor: Secondary | ICD-10-CM | POA: Diagnosis present

## 2015-04-24 DIAGNOSIS — O403XX0 Polyhydramnios, third trimester, not applicable or unspecified: Principal | ICD-10-CM | POA: Diagnosis present

## 2015-04-24 DIAGNOSIS — O9962 Diseases of the digestive system complicating childbirth: Secondary | ICD-10-CM | POA: Diagnosis present

## 2015-04-24 DIAGNOSIS — Z8249 Family history of ischemic heart disease and other diseases of the circulatory system: Secondary | ICD-10-CM

## 2015-04-24 DIAGNOSIS — F1290 Cannabis use, unspecified, uncomplicated: Secondary | ICD-10-CM | POA: Diagnosis present

## 2015-04-24 DIAGNOSIS — O99334 Smoking (tobacco) complicating childbirth: Secondary | ICD-10-CM | POA: Diagnosis not present

## 2015-04-24 DIAGNOSIS — O35EXX0 Maternal care for other (suspected) fetal abnormality and damage, fetal genitourinary anomalies, not applicable or unspecified: Secondary | ICD-10-CM | POA: Diagnosis present

## 2015-04-24 DIAGNOSIS — O99324 Drug use complicating childbirth: Secondary | ICD-10-CM | POA: Diagnosis not present

## 2015-04-24 DIAGNOSIS — Z3491 Encounter for supervision of normal pregnancy, unspecified, first trimester: Secondary | ICD-10-CM

## 2015-04-24 DIAGNOSIS — O358XX0 Maternal care for other (suspected) fetal abnormality and damage, not applicable or unspecified: Secondary | ICD-10-CM | POA: Diagnosis present

## 2015-04-24 DIAGNOSIS — O99331 Smoking (tobacco) complicating pregnancy, first trimester: Secondary | ICD-10-CM

## 2015-04-24 LAB — RAPID URINE DRUG SCREEN, HOSP PERFORMED
AMPHETAMINES: NOT DETECTED
Barbiturates: NOT DETECTED
Benzodiazepines: NOT DETECTED
Cocaine: NOT DETECTED
Opiates: NOT DETECTED
TETRAHYDROCANNABINOL: NOT DETECTED

## 2015-04-24 LAB — CBC
HCT: 36.2 % (ref 36.0–46.0)
Hemoglobin: 12.3 g/dL (ref 12.0–15.0)
MCH: 30.7 pg (ref 26.0–34.0)
MCHC: 34 g/dL (ref 30.0–36.0)
MCV: 90.3 fL (ref 78.0–100.0)
PLATELETS: 268 10*3/uL (ref 150–400)
RBC: 4.01 MIL/uL (ref 3.87–5.11)
RDW: 13.4 % (ref 11.5–15.5)
WBC: 10.4 10*3/uL (ref 4.0–10.5)

## 2015-04-24 LAB — TYPE AND SCREEN
ABO/RH(D): O POS
Antibody Screen: NEGATIVE

## 2015-04-24 LAB — ABO/RH: ABO/RH(D): O POS

## 2015-04-24 MED ORDER — LACTATED RINGERS IV SOLN
INTRAVENOUS | Status: DC
  Administered 2015-04-24 – 2015-04-25 (×3): via INTRAVENOUS

## 2015-04-24 MED ORDER — OXYTOCIN 40 UNITS IN LACTATED RINGERS INFUSION - SIMPLE MED
1.0000 m[IU]/min | INTRAVENOUS | Status: DC
  Administered 2015-04-25: 2 m[IU]/min via INTRAVENOUS
  Filled 2015-04-24: qty 1000

## 2015-04-24 MED ORDER — MISOPROSTOL 200 MCG PO TABS
50.0000 ug | ORAL_TABLET | ORAL | Status: DC
  Administered 2015-04-24 (×2): 50 ug via ORAL
  Filled 2015-04-24 (×2): qty 0.5

## 2015-04-24 MED ORDER — LIDOCAINE HCL (PF) 1 % IJ SOLN: 30.0000 mL | INTRAMUSCULAR | Status: DC | PRN

## 2015-04-24 MED ORDER — FENTANYL CITRATE (PF) 100 MCG/2ML IJ SOLN
50.0000 ug | INTRAMUSCULAR | Status: DC | PRN
  Administered 2015-04-24 (×3): 50 ug via INTRAVENOUS
  Filled 2015-04-24 (×3): qty 2

## 2015-04-24 MED ORDER — FLEET ENEMA 7-19 GM/118ML RE ENEM: 1.0000 | ENEMA | RECTAL | Status: DC | PRN

## 2015-04-24 MED ORDER — LACTATED RINGERS IV SOLN
500.0000 mL | INTRAVENOUS | Status: DC | PRN
  Administered 2015-04-25: 500 mL via INTRAVENOUS

## 2015-04-24 MED ORDER — FENTANYL CITRATE (PF) 100 MCG/2ML IJ SOLN: 50.0000 ug | INTRAMUSCULAR | Status: DC | PRN

## 2015-04-24 MED ORDER — ACETAMINOPHEN 325 MG PO TABS: 650.0000 mg | ORAL_TABLET | ORAL | Status: DC | PRN

## 2015-04-24 MED ORDER — CITRIC ACID-SODIUM CITRATE 334-500 MG/5ML PO SOLN: 30.0000 mL | ORAL | Status: DC | PRN

## 2015-04-24 MED ORDER — ONDANSETRON HCL 4 MG/2ML IJ SOLN
4.0000 mg | Freq: Four times a day (QID) | INTRAMUSCULAR | Status: DC | PRN
  Administered 2015-04-25: 4 mg via INTRAVENOUS
  Filled 2015-04-24: qty 2

## 2015-04-24 MED ORDER — OXYTOCIN BOLUS FROM INFUSION
500.0000 mL | INTRAVENOUS | Status: DC
Start: 2015-04-24 — End: 2015-04-25

## 2015-04-24 MED ORDER — DEXTROSE 5 % IV SOLN
2.5000 10*6.[IU] | INTRAVENOUS | Status: DC
  Administered 2015-04-24 – 2015-04-25 (×5): 2.5 10*6.[IU] via INTRAVENOUS
  Filled 2015-04-24 (×7): qty 2.5

## 2015-04-24 MED ORDER — OXYTOCIN 40 UNITS IN LACTATED RINGERS INFUSION - SIMPLE MED
62.5000 mL/h | INTRAVENOUS | Status: DC
Start: 2015-04-24 — End: 2015-04-25

## 2015-04-24 MED ORDER — TERBUTALINE SULFATE 1 MG/ML IJ SOLN: 0.2500 mg | Freq: Once | INTRAMUSCULAR | Status: DC | PRN

## 2015-04-24 MED ORDER — PENICILLIN G POTASSIUM 5000000 UNITS IJ SOLR
5.0000 10*6.[IU] | Freq: Once | INTRAVENOUS | Status: AC
  Administered 2015-04-24: 5 10*6.[IU] via INTRAVENOUS
  Filled 2015-04-24: qty 5

## 2015-04-24 NOTE — H&P (Deleted)
History   Alison Mclean is a 19 year old G1P0 at 6145w0d being seen today due to complaints of difficulty finding words and numbness.   CSN: [id number]  Arrival date and time: 04/24/15 0947       HPI  OB History    Gravida Para Term Preterm AB TAB SAB Ectopic Multiple Living   1        1       Past Medical History  Diagnosis Date  . GERD (gastroesophageal reflux disease)   . Irritable bowel syndrome (IBS)   . Tobacco smoking affecting pregnancy in first trimester, antepartum 10/16/2014    Past Surgical History  Procedure Laterality Date  . Tonsillectomy      Family History  Problem Relation Age of Onset  . Hypertension Mother   . Diabetes Mother   . Hypertension Maternal Grandmother   . Diabetes Maternal Grandmother   . Cancer Maternal Grandfather     thyroid    Social History  Substance Use Topics  . Smoking status: Current Every Day Smoker -- 0.50 packs/day  . Smokeless tobacco: Never Used  . Alcohol Use: No    Allergies:  Allergies  Allergen Reactions  . Vicodin [Hydrocodone-Acetaminophen] Nausea And Vomiting    Prescriptions prior to admission  Medication Sig Dispense Refill Last Dose  . calcium carbonate (TUMS - DOSED IN MG ELEMENTAL CALCIUM) 500 MG chewable tablet Chew 2 tablets by mouth 2 (two) times daily as needed for indigestion or heartburn.   Past Week at Unknown time  . Prenatal Vit-Min-FA-Fish Oil ([employer] PRENATAL GUMMY PO) Take 2 each by mouth at bedtime.    04/23/2015 at Unknown time    Review of Systems  Constitutional: Negative for fever, chills and weight loss.  HENT: Negative for ear pain and hearing loss.   Eyes: Negative for blurred vision and double vision.  Respiratory: Negative for shortness of breath.   Cardiovascular: Negative.  Negative for chest pain, palpitations and leg swelling.  Gastrointestinal: Negative for heartburn, nausea, vomiting and abdominal pain.  Neurological: Positive for dizziness, speech change and focal  weakness. Negative for weakness and headaches.   Physical Exam   Last menstrual period 08/01/2014, unknown if currently breastfeeding.  Physical Exam  Constitutional: She is oriented to person, place, and time. Vital signs are normal. She appears well-developed and well-nourished. She is not intubated.  HENT:  Head: Normocephalic and atraumatic.  Right Ear: Hearing, external ear and ear canal normal.  Left Ear: Hearing, external ear and ear canal normal.  Nose: Nose normal.  Mouth/Throat: Uvula is midline, oropharynx is clear and moist and mucous membranes are normal. Normal dentition.  Cardiovascular: Normal rate, regular rhythm, S1 normal, S2 normal and normal heart sounds.  Exam reveals no gallop, no S3, no S4, no distant heart sounds and no friction rub.   Respiratory: Effort normal and breath sounds normal. No accessory muscle usage. No apnea, no tachypnea and no bradypnea. She is not intubated. No respiratory distress.  GI: Bowel sounds are normal.  Neurological: She is alert and oriented to person, place, and time. She has normal strength and normal reflexes. No cranial nerve deficit or sensory deficit. GCS eye subscore is 4. GCS verbal subscore is 5. GCS motor subscore is 6.  Psychiatric: She has a normal mood and affect. Her speech is normal and behavior is normal. Judgment and thought content normal. Thought content is not paranoid and not delusional. Cognition and memory are normal. She expresses no homicidal and no  suicidal ideation.    MAU Course  Procedures     Assessment and Plan  Patient is a 19 year old G1P0 presenting with signs of transient neurological event, including numbness and speech difficulty. Symptoms lasted only for about 3 minutes when present. Extensive neurological exam did not show any signs of residual deficits. ddx include TIA, min-stroke, complex partial seizure. Other possibiilties include eclampsia, sever anemia, or infection such as encephalitis. CBC  with diff was normal. Vital signs, including blood pressure are within normal limits. Will need CNS imaging to help confidentially rule out any neurological sequelae.  - Will need to obtain MRI ASAP to rule out possibility of brain damage due to any of the above stated differentials.   Randel Books 04/24/2015, 1:32 PM

## 2015-04-24 NOTE — H&P (Signed)
LABOR ADMISSION HISTORY AND PHYSICAL  Alison Mclean is a 19 y.o. female G1P0 with IUP at [redacted]w[redacted]d by LMP presenting for SROM @ 0745 this morning, 04/24/15. She reports +FM, + contractions, no VB, no blurry vision, headaches or peripheral edema, and RUQ pain.  She plans on breast (then both) feeding. She requests OCP vs Nexplanon for birth control.  Dating: By LMP --->  Estimated Date of Delivery: 05/08/15  Sono:    , CWD, normal detailed fetal anatomy but bilateral UTD A1, Cephalic presentation, vertex lie, 354 g, 57% EFW; normal amniotic fluid volume , CWD, persistent UTD A1, normal amniotic fluid volume, 70% EFW , CWD, persistent UTD, Polyhydramnios with AFI 25.42, 3289 g, >90% EFW.  , CWD, persistent UTD, Polyhydramnios with AFI 24.73, cephalic presentation  Prenatal History/Complications:  Past Medical History: Past Medical History  Diagnosis Date  . GERD (gastroesophageal reflux disease)   . Irritable bowel syndrome (IBS)   . Tobacco smoking affecting pregnancy in first trimester, antepartum 10/16/2014    Past Surgical History: Past Surgical History  Procedure Laterality Date  . Tonsillectomy      Obstetrical History: OB History    Gravida Para Term Preterm AB TAB SAB Ectopic Multiple Living   1        1       Social History: Social History   Social History  . Marital Status: Single    Spouse Name: N/A  . Number of Children: N/A  . Years of Education: N/A   Social History Main Topics  . Smoking status: Current Every Day Smoker -- 0.50 packs/day  . Smokeless tobacco: Never Used  . Alcohol Use: No  . Drug Use: No  . Sexual Activity: Yes    Birth Control/ Protection: None   Other Topics Concern  . None   Social History Narrative    Family History: Family History  Problem Relation Age of Onset  . Hypertension Mother   . Diabetes Mother   . Hypertension Maternal Grandmother   . Diabetes Maternal Grandmother   . Cancer Maternal  Grandfather     thyroid    Allergies: Allergies  Allergen Reactions  . Vicodin [Hydrocodone-Acetaminophen] Nausea And Vomiting    Prescriptions prior to admission  Medication Sig Dispense Refill Last Dose  . calcium carbonate (TUMS - DOSED IN MG ELEMENTAL CALCIUM) 500 MG chewable tablet Chew 2 tablets by mouth 2 (two) times daily as needed for indigestion or heartburn.   Past Week at Unknown time  . Prenatal Vit-Min-FA-Fish Oil ([employer] PRENATAL GUMMY PO) Take 2 each by mouth at bedtime.    04/23/2015 at Unknown time     Review of Systems   All systems reviewed and negative except as stated in HPI  LMP 08/01/2014 (Exact Date) General appearance: alert, cooperative and no distress Lungs: clear to auscultation bilaterally Heart: regular rate and rhythm Abdomen: soft, non-tender; bowel sounds normal Pelvic: Cervix dilated to 1, 50% effaced, anterior Extremities: Homans sign is negative, no sign of DVT, edema Presentation: cephalic Fetal monitoringBaseline: 135 bpm, Variability: Good {> 6 bpm), Accelerations: Reactive and Decelerations: Absent Uterine activityFrequency: Occasional Dilation: 1 Effacement (%): 50 Station: -3 Exam by:: K.Wilson,RN   Prenatal labs: ABO, Rh: O/POS/-- (04/06 1528) Antibody: NEG (04/06 1528) Rubella:  Immune RPR: NON REAC (08/03 1455)  HBsAg: NEGATIVE (04/06 1528)  HIV: NONREACTIVE (08/03 1455)  GBS: Positive (04/06 0000)  1 hr Glucola 57 (early), 76 (third trimester) Genetic screening  Anatomy US bilateral renal pyelectasis, persistent at  29 and 35 weeks  Prenatal Transfer Tool  Maternal Diabetes: No Genetic Screening: Normal primary screen Maternal Ultrasounds/Referrals: Other: Repeat for polyhydramnios Fetal Ultrasounds or other Referrals:  Other: Repeat to evaluate bilateral UTD A1 Maternal Substance Abuse:  Yes:  Type: Smoker Significant Maternal Medications:  None Significant Maternal Lab Results: Lab values include: Group B Strep  positive  Results for orders placed or performed during the hospital encounter of 04/24/15 (from the past 24 hour(s))  CBC   Collection Time: 04/24/15 10:45 AM  Result Value Ref Range   WBC 10.4 4.0 - 10.5 K/uL   RBC 4.01 3.87 - 5.11 MIL/uL   Hemoglobin 12.3 12.0 - 15.0 g/dL   HCT 16.136.2 09.636.0 - 04.546.0 %   MCV 90.3 78.0 - 100.0 fL   MCH 30.7 26.0 - 34.0 pg   MCHC 34.0 30.0 - 36.0 g/dL   RDW 40.913.4 81.111.5 - 91.415.5 %   Platelets 268 150 - 400 K/uL    Patient Active Problem List   Diagnosis Date Noted  . Polyhydramnios in third trimester 04/22/2015  . Candidiasis of the genitals, female 03/12/2015  . Transient hypertension of pregnancy in third trimester 02/26/2015  . Pyelectasis of fetus on prenatal ultrasound   . Tobacco smoking affecting pregnancy in second trimester, antepartum 12/18/2014  . Marijuana use 11/03/2014  . Group B streptococcal bacteriuria 10/21/2014  . Supervision of normal pregnancy in first trimester 10/16/2014  . Tobacco smoking affecting pregnancy in first trimester, antepartum 10/16/2014    Assessment: Alison Mclean is a 19 y.o. G1P0 at 5025w0d here for augmentation of labor after SROM. Pregnancy with complications of polyhydramnios and fetal urinary tract dilation seen on ultrasound. SROM 08:00 today.  #Labor: Plan to augment with FB, cytotec and pitocin.  #Pain: Planning for epidural  #FWB: Cat I #ID:  GBS positive, PCN started #MOF: Breast, then both #MOC: OCP vs Nexplanon #MJ use early pregnancy: f/u UDS #Circ: Yes, OP ped in [place]  Dani GobbleHillary Fitzgerald, MD [facility], PGY-1  OB FELLOW HISTORY AND PHYSICAL ATTESTATION  I have seen and examined this patient; I agree with above documentation in the resident's note.     Cherrie Gauzeoah B Billie Intriago 04/24/2015, 5:36 PM

## 2015-04-24 NOTE — Progress Notes (Signed)
   Alison DanceVictoria R Mclean is a 19 y.o. G1P0 at 6822w0d  admitted for PROM  Subjective:  Feels crampy Objective: Filed Vitals:   04/24/15 1600 04/24/15 1609 04/24/15 1700 04/24/15 1908  BP: 130/71  120/68 115/52  Pulse: 88  80 62  Temp: 98.2 F (36.8 C)   98.1 F (36.7 C)  TempSrc: Oral   Oral  Resp: 18  18   Height:  5\' 4"  (1.626 m)    Weight:  93.895 kg (207 lb)        FHT:  FHR: 140 bpm, variability: moderate,  accelerations:  Present,  decelerations:  Absent UC:   rare SVE: deferred.  Foley still in  Labs: Lab Results  Component Value Date   WBC 10.4 04/24/2015   HGB 12.3 04/24/2015   HCT 36.2 04/24/2015   MCV 90.3 04/24/2015   PLT 268 04/24/2015    Assessment / Plan: PROM Will start pitocin when foley falls out.  Continue oral cytotec until then Labor: ripening phase IOL Fetal Wellbeing:  Category I Pain Control:  Labor support without medications Anticipated MOD:  NSVD  CRESENZO-DISHMAN,Akil Hoos 04/24/2015, 9:29 PM

## 2015-04-25 ENCOUNTER — Inpatient Hospital Stay (HOSPITAL_COMMUNITY): Payer: Medicaid Other | Admitting: Anesthesiology

## 2015-04-25 ENCOUNTER — Encounter (HOSPITAL_COMMUNITY): Payer: Self-pay | Admitting: *Deleted

## 2015-04-25 ENCOUNTER — Other Ambulatory Visit: Payer: No Typology Code available for payment source

## 2015-04-25 DIAGNOSIS — Z3A38 38 weeks gestation of pregnancy: Secondary | ICD-10-CM

## 2015-04-25 DIAGNOSIS — O99824 Streptococcus B carrier state complicating childbirth: Secondary | ICD-10-CM

## 2015-04-25 DIAGNOSIS — F1290 Cannabis use, unspecified, uncomplicated: Secondary | ICD-10-CM

## 2015-04-25 DIAGNOSIS — O403XX0 Polyhydramnios, third trimester, not applicable or unspecified: Secondary | ICD-10-CM

## 2015-04-25 DIAGNOSIS — O99324 Drug use complicating childbirth: Secondary | ICD-10-CM

## 2015-04-25 DIAGNOSIS — O99334 Smoking (tobacco) complicating childbirth: Secondary | ICD-10-CM

## 2015-04-25 LAB — RPR: RPR Ser Ql: NONREACTIVE

## 2015-04-25 MED ORDER — FAMOTIDINE 20 MG PO TABS: 20.0000 mg | ORAL_TABLET | ORAL | Status: DC | PRN

## 2015-04-25 MED ORDER — PHENYLEPHRINE 40 MCG/ML (10ML) SYRINGE FOR IV PUSH (FOR BLOOD PRESSURE SUPPORT)
80.0000 ug | PREFILLED_SYRINGE | INTRAVENOUS | Status: DC | PRN
  Filled 2015-04-25: qty 20

## 2015-04-25 MED ORDER — PRENATAL MULTIVITAMIN CH
1.0000 | ORAL_TABLET | Freq: Every day | ORAL | Status: DC
  Administered 2015-04-26 – 2015-04-27 (×2): 1 via ORAL
  Filled 2015-04-25 (×2): qty 1

## 2015-04-25 MED ORDER — OXYCODONE-ACETAMINOPHEN 5-325 MG PO TABS: 2.0000 | ORAL_TABLET | ORAL | Status: DC | PRN

## 2015-04-25 MED ORDER — LIDOCAINE HCL (PF) 1 % IJ SOLN
INTRAMUSCULAR | Status: DC | PRN
  Administered 2015-04-25 (×2): 4 mL

## 2015-04-25 MED ORDER — FENTANYL 2.5 MCG/ML BUPIVACAINE 1/10 % EPIDURAL INFUSION (WH - ANES)
14.0000 mL/h | INTRAMUSCULAR | Status: DC | PRN
  Administered 2015-04-25 (×2): 14 mL/h via EPIDURAL
  Filled 2015-04-25 (×2): qty 125

## 2015-04-25 MED ORDER — METHYLERGONOVINE MALEATE 0.2 MG/ML IJ SOLN
INTRAMUSCULAR | Status: AC
  Filled 2015-04-25: qty 1

## 2015-04-25 MED ORDER — METHYLERGONOVINE MALEATE 0.2 MG/ML IJ SOLN
0.2000 mg | Freq: Once | INTRAMUSCULAR | Status: AC
  Administered 2015-04-25: 0.2 mg via INTRAMUSCULAR

## 2015-04-25 MED ORDER — ZOLPIDEM TARTRATE 5 MG PO TABS: 5.0000 mg | ORAL_TABLET | Freq: Every evening | ORAL | Status: DC | PRN

## 2015-04-25 MED ORDER — IBUPROFEN 600 MG PO TABS
600.0000 mg | ORAL_TABLET | Freq: Four times a day (QID) | ORAL | Status: DC
  Administered 2015-04-25 – 2015-04-27 (×9): 600 mg via ORAL
  Filled 2015-04-25 (×9): qty 1

## 2015-04-25 MED ORDER — EPHEDRINE 5 MG/ML INJ: 10.0000 mg | INTRAVENOUS | Status: DC | PRN

## 2015-04-25 MED ORDER — BENZOCAINE-MENTHOL 20-0.5 % EX AERO
1.0000 "application " | INHALATION_SPRAY | CUTANEOUS | Status: DC | PRN
  Administered 2015-04-25: 1 via TOPICAL
  Filled 2015-04-25: qty 56

## 2015-04-25 MED ORDER — ONDANSETRON HCL 4 MG PO TABS: 4.0000 mg | ORAL_TABLET | ORAL | Status: DC | PRN

## 2015-04-25 MED ORDER — PNEUMOCOCCAL VAC POLYVALENT 25 MCG/0.5ML IJ INJ
0.5000 mL | INJECTION | INTRAMUSCULAR | Status: AC
  Administered 2015-04-27: 0.5 mL via INTRAMUSCULAR
  Filled 2015-04-25: qty 0.5

## 2015-04-25 MED ORDER — WITCH HAZEL-GLYCERIN EX PADS: 1.0000 "application " | MEDICATED_PAD | CUTANEOUS | Status: DC | PRN

## 2015-04-25 MED ORDER — DIBUCAINE 1 % RE OINT: 1.0000 "application " | TOPICAL_OINTMENT | RECTAL | Status: DC | PRN

## 2015-04-25 MED ORDER — SIMETHICONE 80 MG PO CHEW: 80.0000 mg | CHEWABLE_TABLET | ORAL | Status: DC | PRN

## 2015-04-25 MED ORDER — ONDANSETRON HCL 4 MG/2ML IJ SOLN: 4.0000 mg | INTRAMUSCULAR | Status: DC | PRN

## 2015-04-25 MED ORDER — ACETAMINOPHEN 325 MG PO TABS: 650.0000 mg | ORAL_TABLET | ORAL | Status: DC | PRN

## 2015-04-25 MED ORDER — DIPHENHYDRAMINE HCL 50 MG/ML IJ SOLN
12.5000 mg | INTRAMUSCULAR | Status: DC | PRN
  Administered 2015-04-25: 12.5 mg via INTRAVENOUS
  Filled 2015-04-25: qty 1

## 2015-04-25 MED ORDER — OXYCODONE-ACETAMINOPHEN 5-325 MG PO TABS: 1.0000 | ORAL_TABLET | ORAL | Status: DC | PRN

## 2015-04-25 MED ORDER — TETANUS-DIPHTH-ACELL PERTUSSIS 5-2.5-18.5 LF-MCG/0.5 IM SUSP: 0.5000 mL | Freq: Once | INTRAMUSCULAR | Status: DC

## 2015-04-25 MED ORDER — DIPHENHYDRAMINE HCL 25 MG PO CAPS: 25.0000 mg | ORAL_CAPSULE | Freq: Four times a day (QID) | ORAL | Status: DC | PRN

## 2015-04-25 MED ORDER — LANOLIN HYDROUS EX OINT: TOPICAL_OINTMENT | CUTANEOUS | Status: DC | PRN

## 2015-04-25 MED ORDER — SENNOSIDES-DOCUSATE SODIUM 8.6-50 MG PO TABS
2.0000 | ORAL_TABLET | ORAL | Status: DC
  Administered 2015-04-26 (×2): 2 via ORAL
  Filled 2015-04-25 (×2): qty 2

## 2015-04-25 NOTE — Progress Notes (Signed)
   Alison Mclean is a 19 y.o. G1P0 at 2938w1d  admitted for PROM  Subjective:  Comfortable with epidural Objective: Filed Vitals:   04/25/15 0600 04/25/15 0630 04/25/15 0700 04/25/15 0730  BP: 112/54 116/63 115/58 101/52  Pulse: 88 80 60 70  Temp: 97.4 F (36.3 C)     TempSrc: Oral     Resp: 18 18 18 18   Height:      Weight:      SpO2:          FHT:  FHR: 135 bpm, variability: moderate,  accelerations:  Present,  decelerations:  Absent UC:   irregular, every 3-6 minutes SVE:   Dilation: 8 Effacement (%): 80 Station: -2 Exam by:: Cresenzo, CNM  Pitocin @ 12 mu/min Ctx still mild, spaced out.   Labs: Lab Results  Component Value Date   WBC 10.4 04/24/2015   HGB 12.3 04/24/2015   HCT 36.2 04/24/2015   MCV 90.3 04/24/2015   PLT 268 04/24/2015    Assessment / Plan: Protracted active phase Increase pitocin per protocol  Labor: Progressing normally Fetal Wellbeing:  Category I Pain Control:  Epidural Anticipated MOD:  NSVD  CRESENZO-DISHMAN,Tedd Cottrill 04/25/2015, 7:36 AM

## 2015-04-25 NOTE — Anesthesia Procedure Notes (Signed)
Epidural Patient location during procedure: OB  Staffing Anesthesiologist: Cobi Aldape Performed by: anesthesiologist   Preanesthetic Checklist Completed: patient identified, site marked, surgical consent, pre-op evaluation, timeout performed, IV checked, risks and benefits discussed and monitors and equipment checked  Epidural Patient position: sitting Prep: site prepped and draped and DuraPrep Patient monitoring: continuous pulse ox and blood pressure Approach: midline Location: L3-L4 Injection technique: LOR saline  Needle:  Needle type: Tuohy  Needle gauge: 17 G Needle length: 9 cm and 9 Needle insertion depth: 7 cm Catheter type: closed end flexible Catheter size: 19 Gauge Catheter at skin depth: 12 cm Test dose: negative  Assessment Events: blood not aspirated, injection not painful, no injection resistance, negative IV test and no paresthesia  Additional Notes Patient identified. Risks/Benefits/Options discussed with patient including but not limited to bleeding, infection, nerve damage, paralysis, failed block, incomplete pain control, headache, blood pressure changes, nausea, vomiting, reactions to medication both or allergic, itching and postpartum back pain. Confirmed with bedside nurse the patient's most recent platelet count. Confirmed with patient that they are not currently taking any anticoagulation, have any bleeding history or any family history of bleeding disorders. Patient expressed understanding and wished to proceed. All questions were answered. Sterile technique was used throughout the entire procedure. Please see nursing notes for vital signs. Test dose was given through epidural catheter and negative prior to continuing to dose epidural or start infusion. Warning signs of high block given to the patient including shortness of breath, tingling/numbness in hands, complete motor block, or any concerning symptoms with instructions to call for help. Patient was  given instructions on fall risk and not to get out of bed. All questions and concerns addressed with instructions to call with any issues or inadequate analgesia.      

## 2015-04-25 NOTE — Addendum Note (Signed)
Addendum  created 04/25/15 1846 by Junious SilkMelinda Kieley Akter, CRNA   Modules edited: Notes Section   Notes Section:  File: [id number]

## 2015-04-25 NOTE — Progress Notes (Signed)
   Alison Mclean is a 19 y.o. G1P0 at 5126w1d  admitted for PROM  Subjective: No c/o  Objective: Filed Vitals:   04/24/15 1609 04/24/15 1700 04/24/15 1908 04/24/15 2155  BP:  120/68 115/52 113/53  Pulse:  80 62 71  Temp:   98.1 F (36.7 C) 98 F (36.7 C)  TempSrc:   Oral Axillary  Resp:  18  18  Height: 5\' 4"  (1.626 m)     Weight: 93.895 kg (207 lb)         FHT:  FHR: 140 bpm, variability: moderate,  accelerations:  Present,  decelerations:  Absent UC:   occ SVE:   Dilation: 4 Effacement (%): 60 Station: -2 Exam by:: Susy ManorErica Johnson, RN  Foley fell out Last cytotec 4 hours ago Labs: Lab Results  Component Value Date   WBC 10.4 04/24/2015   HGB 12.3 04/24/2015   HCT 36.2 04/24/2015   MCV 90.3 04/24/2015   PLT 268 04/24/2015    Assessment / Plan: PROM, ripening phase complete.  Will start pitocin  Labor: Progressing normally Fetal Wellbeing:  Category I Pain Control:  Labor support without medications Anticipated MOD:  NSVD  CRESENZO-DISHMAN,Aedon Deason 04/25/2015, 12:03 AM

## 2015-04-25 NOTE — Progress Notes (Signed)
Labor Progress Note Alison Mclean is a 19 y.o. G1P0 at 2469w1d presented for SROM, Poly (AFI 24)  S: Called to room for deceleration   O:  BP 118/64 mmHg  Pulse 78  Temp(Src) 98.1 F (36.7 C) (Oral)  Resp 18  Ht 5\' 4"  (1.626 m)  Wt 207 lb (93.895 kg)  BMI 35.51 kg/m2  SpO2 100%  LMP 08/01/2014 (Exact Date) EFM: 150/min/no accels, variable deceleration to the 120s for < 1 minute  CVE: Dilation: 8.5 Effacement (%): 80 Cervical Position: Anterior Station: +1 Presentation: Vertex Exam by:: Alison Gorton MD   A&P: 19 y.o. G1P0 4169w1d here with SROM with augmentation with pitocin #Labor: Placed IUPC to help with pitocin up titration. Continue to increase pit to adequate ctx pattern and strength.  #Pain: s/p epidural  #FWB: Cat II #GBS PCN started  Alison FlakeKimberly Niles Cipriano Millikan, MD 9:34 AM

## 2015-04-25 NOTE — Lactation Note (Signed)
This note was copied from the chart of Alison Tanise Mclean. Lactation Consultation Note  Patient Name: Alison Mclean Today's Date: 04/25/2015 Reason for consult: Initial assessment Baby was sleeping and mom was eating. She reports baby was feeding good but over the last couple of hours he has been sleeping. She plans to eat then get baby sts. She was sitting up in the chair with her gown on, it appears that she has wide spaced tubular breast, she may needed a breast assessment. Went over feeding frequency and lactation support. She is aware of O/P services and support group. She will page as needed for latch help.   Maternal Data Has patient been taught Hand Expression?: Yes Does the patient have breastfeeding experience prior to this delivery?: No  Feeding    LATCH Score/Interventions                      Lactation Tools Discussed/Used WIC Program: Yes   Consult Status Consult Status: Follow-up Date: 04/26/15 Follow-up type: In-patient    Rulon Eisenmengerlizabeth E Trena Dunavan 04/25/2015, 9:35 PM

## 2015-04-25 NOTE — Anesthesia Preprocedure Evaluation (Signed)
Anesthesia Evaluation  Patient identified by MRN, date of birth, ID band Patient awake    Reviewed: Allergy & Precautions, NPO status , Patient's Chart, lab work & pertinent test results  History of Anesthesia Complications Negative for: history of anesthetic complications  Airway Mallampati: II  TM Distance: >3 FB Neck ROM: Full    Dental no notable dental hx. (+) Dental Advisory Given   Pulmonary Current Smoker,    Pulmonary exam normal breath sounds clear to auscultation       Cardiovascular negative cardio ROS Normal cardiovascular exam Rhythm:Regular Rate:Normal     Neuro/Psych negative neurological ROS  negative psych ROS   GI/Hepatic Neg liver ROS, GERD  ,  Endo/Other  obesity  Renal/GU negative Renal ROS  negative genitourinary   Musculoskeletal negative musculoskeletal ROS (+)   Abdominal   Peds negative pediatric ROS (+)  Hematology negative hematology ROS (+)   Anesthesia Other Findings   Reproductive/Obstetrics (+) Pregnancy                             Anesthesia Physical Anesthesia Plan  ASA: II  Anesthesia Plan: Epidural   Post-op Pain Management:    Induction:   Airway Management Planned:   Additional Equipment:   Intra-op Plan:   Post-operative Plan:   Informed Consent: I have reviewed the patients History and Physical, chart, labs and discussed the procedure including the risks, benefits and alternatives for the proposed anesthesia with the patient or authorized representative who has indicated his/her understanding and acceptance.     Plan Discussed with:   Anesthesia Plan Comments:         Anesthesia Quick Evaluation

## 2015-04-25 NOTE — Progress Notes (Signed)
UR chart review completed.  

## 2015-04-25 NOTE — Anesthesia Postprocedure Evaluation (Signed)
  Anesthesia Post-op Note  Patient: Alison Mclean  Procedure(s) Performed: * No procedures listed *  Patient Location: Mother/Baby  Anesthesia Type:Epidural  Level of Consciousness: awake, alert  and oriented  Airway and Oxygen Therapy: Patient Spontanous Breathing  Post-op Pain: none  Post-op Assessment: Post-op Vital signs reviewed, Patient's Cardiovascular Status Stable, Respiratory Function Stable, Patent Airway, No signs of Nausea or vomiting, Adequate PO intake, Pain level controlled and No headache              Post-op Vital Signs: Reviewed and stable  Last Vitals:  Filed Vitals:   04/25/15 1824  BP: 120/70  Pulse: 70  Temp: 36.6 C  Resp: 24    Complications: No apparent anesthesia complications

## 2015-04-26 LAB — CBC
HEMATOCRIT: 29.4 % — AB (ref 36.0–46.0)
HEMOGLOBIN: 9.9 g/dL — AB (ref 12.0–15.0)
MCH: 30.6 pg (ref 26.0–34.0)
MCHC: 33.7 g/dL (ref 30.0–36.0)
MCV: 90.7 fL (ref 78.0–100.0)
Platelets: 233 10*3/uL (ref 150–400)
RBC: 3.24 MIL/uL — ABNORMAL LOW (ref 3.87–5.11)
RDW: 13.6 % (ref 11.5–15.5)
WBC: 11.5 10*3/uL — AB (ref 4.0–10.5)

## 2015-04-26 NOTE — Progress Notes (Signed)
Post Partum Day 1  Subjective:  Alison Mclean is a 19 y.o. G1P1001 8176w1d s/p SVD.  No acute events overnight.  Pt denies problems with ambulating, voiding or po intake. No HA, dizziness, lightheadedness, SOB, CP, or weakness. She denies nausea or vomiting.  Pain is well controlled.  She has had flatus. She has not had bowel movement.  Lochia Mild.  Plan for birth control is deciding b/t OCP vs Nex.  Method of Feeding: Breast  Objective: BP 108/47 mmHg  Pulse 61  Temp(Src) 97.9 F (36.6 C) (Oral)  Resp 16  Ht 5\' 4"  (1.626 m)  Wt 93.895 kg (207 lb)  BMI 35.51 kg/m2  SpO2 100%  LMP 08/01/2014 (Exact Date)  Breastfeeding? Unknown  Physical Exam:  General: alert, cooperative and no distress Lochia:normal flow Chest: CTAB Heart: RRR no m/r/g Abdomen: +BS, soft, nontender, fundus firm at/below umbilicus DVT Evaluation: No evidence of DVT seen on physical exam. Extremities: no edema   Recent Labs  04/24/15 1045 04/26/15 0558  HGB 12.3 9.9*  HCT 36.2 29.4*    Assessment/Plan:  ASSESSMENT: Alison Mclean is a 19 y.o. G1P1001 2976w1d ppd #1 s/p NSVD doing well.  S/p PPH ~66450ml. Hgb 12.3 >> 9.9. Currently asymptomatic. Will monitor.  Plan for discharge tomorrow and Breastfeeding   LOS: 2 days   Mickie Hillieran McKeag 04/26/2015, 8:43 AM   CNM attestation Post Partum Day #1 I have seen and examined this patient and agree with above documentation in the resident's note.   Alison Mclean is a 19 y.o. G1P1001 s/p SVD.  Pt denies problems with ambulating, voiding or po intake. Pain is well controlled.  Plan for birth control is OCPs vs Nexplanon.  Method of Feeding: breast  PE:  BP 115/78 mmHg  Pulse 60  Temp(Src) 98.2 F (36.8 C) (Oral)  Resp 18  Ht 5\' 4"  (1.626 m)  Wt 93.895 kg (207 lb)  BMI 35.51 kg/m2  SpO2 100%  LMP 08/01/2014 (Exact Date)  Breastfeeding? Unknown Fundus firm  Plan for discharge: 04/27/15  Cam HaiSHAW, [place], CNM 8:45 PM  04/30/2015

## 2015-04-27 MED ORDER — IBUPROFEN 600 MG PO TABS: 600.0000 mg | ORAL_TABLET | Freq: Four times a day (QID) | ORAL | Status: AC | PRN

## 2015-04-27 NOTE — Discharge Instructions (Signed)
NO SEX UNTIL AFTER YOU GET YOUR BIRTH CONTROL  ° °Postpartum Care After Vaginal Delivery °After you deliver your newborn (postpartum period), the usual stay in the hospital is 24-72 hours. If there were problems with your labor or delivery, or if you have other medical problems, you might be in the hospital longer.  °While you are in the hospital, you will receive help and instructions on how to care for yourself and your newborn during the postpartum period.  °While you are in the hospital: °· Be sure to tell your nurses if you have pain or discomfort, as well as where you feel the pain and what makes the pain worse. °· If you had an incision made near your vagina (episiotomy) or if you had some tearing during delivery, the nurses may put ice packs on your episiotomy or tear. The ice packs may help to reduce the pain and swelling. °· If you are breastfeeding, you may feel uncomfortable contractions of your uterus for a couple of weeks. This is normal. The contractions help your uterus get back to normal size. °· It is normal to have some bleeding after delivery. °· For the first 1-3 days after delivery, the flow is red and the amount may be similar to a period. °· It is common for the flow to start and stop. °· In the first few days, you may pass some small clots. Let your nurses know if you begin to pass large clots or your flow increases. °· Do not  flush blood clots down the toilet before having the nurse look at them. °· During the next 3-10 days after delivery, your flow should become more watery and pink or brown-tinged in color. °· Ten to fourteen days after delivery, your flow should be a small amount of yellowish-white discharge. °· The amount of your flow will decrease over the first few weeks after delivery. Your flow may stop in 6-8 weeks. Most women have had their flow stop by 12 weeks after delivery. °· You should change your sanitary pads frequently. °· Wash your hands thoroughly with soap and water  for at least 20 seconds after changing pads, using the toilet, or before holding or feeding your newborn. °· You should feel like you need to empty your bladder within the first 6-8 hours after delivery. °· In case you become weak, lightheaded, or faint, call your nurse before you get out of bed for the first time and before you take a shower for the first time. °· Within the first few days after delivery, your breasts may begin to feel tender and full. This is called engorgement. Breast tenderness usually goes away within 48-72 hours after engorgement occurs. You may also notice milk leaking from your breasts. If you are not breastfeeding, do not stimulate your breasts. Breast stimulation can make your breasts produce more milk. °· Spending as much time as possible with your newborn is very important. During this time, you and your newborn can feel close and get to know each other. Having your newborn stay in your room (rooming in) will help to strengthen the bond with your newborn.  It will give you time to get to know your newborn and become comfortable caring for your newborn. °· Your hormones change after delivery. Sometimes the hormone changes can temporarily cause you to feel sad or tearful. These feelings should not last more than a few days. If these feelings last longer than that, you should talk to your caregiver. °· If desired,   talk to your caregiver about methods of family planning or contraception. °· Talk to your caregiver about immunizations. Your caregiver may want you to have the following immunizations before leaving the hospital: °· Tetanus, diphtheria, and pertussis (Tdap) or tetanus and diphtheria (Td) immunization. It is very important that you and your family (including grandparents) or others caring for your newborn are up-to-date with the Tdap or Td immunizations. The Tdap or Td immunization can help protect your newborn from getting ill. °· Rubella immunization. °· Varicella (chickenpox)  immunization. °· Influenza immunization. You should receive this annual immunization if you did not receive the immunization during your pregnancy. °  °This information is not intended to replace advice given to you by your health care provider. Make sure you discuss any questions you have with your health care provider. °  °Document Released: 04/25/2007 Document Revised: 03/22/2012 Document Reviewed: 02/23/2012 °Elsevier Interactive Patient Education ©2016 [employer] ° °Breastfeeding °Deciding to breastfeed is one of the best choices you can make for you and your baby. A change in hormones during pregnancy causes your breast tissue to grow and increases the number and size of your milk ducts. These hormones also allow proteins, sugars, and fats from your blood supply to make breast milk in your milk-producing glands. Hormones prevent breast milk from being released before your baby is born as well as prompt milk flow after birth. Once breastfeeding has begun, thoughts of your baby, as well as his or her sucking or crying, can stimulate the release of milk from your milk-producing glands.  °BENEFITS OF BREASTFEEDING °For Your Baby °· Your first milk (colostrum) helps your baby's digestive system function better. °· There are antibodies in your milk that help your baby fight off infections. °· Your baby has a lower incidence of asthma, allergies, and sudden infant death syndrome. °· The nutrients in breast milk are better for your baby than infant formulas and are designed uniquely for your baby's needs. °· Breast milk improves your baby's brain development. °· Your baby is less likely to develop other conditions, such as childhood obesity, asthma, or type 2 diabetes mellitus. °For You °· Breastfeeding helps to create a very special bond between you and your baby. °· Breastfeeding is convenient. Breast milk is always available at the correct temperature and costs nothing. °· Breastfeeding helps to burn calories  and helps you lose the weight gained during pregnancy. °· Breastfeeding makes your uterus contract to its prepregnancy size faster and slows bleeding (lochia) after you give birth.   °· Breastfeeding helps to lower your risk of developing type 2 diabetes mellitus, osteoporosis, and breast or ovarian cancer later in life. °SIGNS THAT YOUR BABY IS HUNGRY °Early Signs of Hunger °· Increased alertness or activity. °· Stretching. °· Movement of the head from side to side. °· Movement of the head and opening of the mouth when the corner of the mouth or cheek is stroked (rooting). °· Increased sucking sounds, smacking lips, cooing, sighing, or squeaking. °· Hand-to-mouth movements. °· Increased sucking of fingers or hands. °Late Signs of Hunger °· Fussing. °· Intermittent crying. °Extreme Signs of Hunger °Signs of extreme hunger will require calming and consoling before your baby will be able to breastfeed successfully. Do not wait for the following signs of extreme hunger to occur before you initiate breastfeeding: °· Restlessness. °· A loud, strong cry. °· Screaming. °BREASTFEEDING BASICS °Breastfeeding Initiation °· Find a comfortable place to sit or lie down, with your neck and back well supported. °· Place   a pillow or rolled up blanket under your baby to bring him or her to the level of your breast (if you are seated). Nursing pillows are specially designed to help support your arms and your baby while you breastfeed. °· Make sure that your baby's abdomen is facing your abdomen. °· Gently massage your breast. With your fingertips, massage from your chest wall toward your nipple in a circular motion. This encourages milk flow. You may need to continue this action during the feeding if your milk flows slowly. °· Support your breast with 4 fingers underneath and your thumb above your nipple. Make sure your fingers are well away from your nipple and your baby's mouth. °· Stroke your baby's lips gently with your finger or  nipple. °· When your baby's mouth is open wide enough, quickly bring your baby to your breast, placing your entire nipple and as much of the colored area around your nipple (areola) as possible into your baby's mouth. °¨ More areola should be visible above your baby's upper lip than below the lower lip. °¨ Your baby's tongue should be between his or her lower gum and your breast. °· Ensure that your baby's mouth is correctly positioned around your nipple (latched). Your baby's lips should create a seal on your breast and be turned out (everted). °· It is common for your baby to suck about 2-3 minutes in order to start the flow of breast milk. °Latching °Teaching your baby how to latch on to your breast properly is very important. An improper latch can cause nipple pain and decreased milk supply for you and poor weight gain in your baby. Also, if your baby is not latched onto your nipple properly, he or she may swallow some air during feeding. This can make your baby fussy. Burping your baby when you switch breasts during the feeding can help to get rid of the air. However, teaching your baby to latch on properly is still the best way to prevent fussiness from swallowing air while breastfeeding. °Signs that your baby has successfully latched on to your nipple: °· Silent tugging or silent sucking, without causing you pain. °· Swallowing heard between every 3-4 sucks. °· Muscle movement above and in front of his or her ears while sucking. °Signs that your baby has not successfully latched on to nipple: °· Sucking sounds or smacking sounds from your baby while breastfeeding. °· Nipple pain. °If you think your baby has not latched on correctly, slip your finger into the corner of your baby's mouth to break the suction and place it between your baby's gums. Attempt breastfeeding initiation again. °Signs of Successful Breastfeeding °Signs from your baby: °· A gradual decrease in the number of sucks or complete cessation of  sucking. °· Falling asleep. °· Relaxation of his or her body. °· Retention of a small amount of milk in his or her mouth. °· Letting go of your breast by himself or herself. °Signs from you: °· Breasts that have increased in firmness, weight, and size 1-3 hours after feeding. °· Breasts that are softer immediately after breastfeeding. °· Increased milk volume, as well as a change in milk consistency and color by the fifth day of breastfeeding. °· Nipples that are not sore, cracked, or bleeding. °Signs That Your Baby is Getting Enough Milk °· Wetting at least 3 diapers in a 24-hour period. The urine should be clear and pale yellow by age 5 days. °· At least 3 stools in a 24-hour period by age   5 days. The stool should be soft and yellow. °· At least 3 stools in a 24-hour period by age 7 days. The stool should be seedy and yellow. °· No loss of weight greater than 10% of birth weight during the first 3 days of age. °· Average weight gain of 4-7 ounces (113-198 g) per week after age 4 days. °· Consistent daily weight gain by age 5 days, without weight loss after the age of 2 weeks. °After a feeding, your baby may spit up a small amount. This is common. °BREASTFEEDING FREQUENCY AND DURATION °Frequent feeding will help you make more milk and can prevent sore nipples and breast engorgement. Breastfeed when you feel the need to reduce the fullness of your breasts or when your baby shows signs of hunger. This is called "breastfeeding on demand." Avoid introducing a pacifier to your baby while you are working to establish breastfeeding (the first 4-6 weeks after your baby is born). After this time you may choose to use a pacifier. Research has shown that pacifier use during the first year of a baby's life decreases the risk of sudden infant death syndrome (SIDS). °Allow your baby to feed on each breast as long as he or she wants. Breastfeed until your baby is finished feeding. When your baby unlatches or falls asleep while  feeding from the first breast, offer the second breast. Because newborns are often sleepy in the first few weeks of life, you may need to awaken your baby to get him or her to feed. °Breastfeeding times will vary from baby to baby. However, the following rules can serve as a guide to help you ensure that your baby is properly fed: °· Newborns (babies 4 weeks of age or younger) may breastfeed every 1-3 hours. °· Newborns should not go longer than 3 hours during the day or 5 hours during the night without breastfeeding. °· You should breastfeed your baby a minimum of 8 times in a 24-hour period until you begin to introduce solid foods to your baby at around 6 months of age. °BREAST MILK PUMPING °Pumping and storing breast milk allows you to ensure that your baby is exclusively fed your breast milk, even at times when you are unable to breastfeed. This is especially important if you are going back to work while you are still breastfeeding or when you are not able to be present during feedings. Your lactation consultant can give you guidelines on how long it is safe to store breast milk. °A breast pump is a machine that allows you to pump milk from your breast into a sterile bottle. The pumped breast milk can then be stored in a refrigerator or freezer. Some breast pumps are operated by hand, while others use electricity. Ask your lactation consultant which type will work best for you. Breast pumps can be purchased, but some hospitals and breastfeeding support groups lease breast pumps on a monthly basis. A lactation consultant can teach you how to hand express breast milk, if you prefer not to use a pump. °CARING FOR YOUR BREASTS WHILE YOU BREASTFEED °Nipples can become dry, cracked, and sore while breastfeeding. The following recommendations can help keep your breasts moisturized and healthy: °· Avoid using soap on your nipples. °· Wear a supportive bra. Although not required, special nursing bras and tank tops are  designed to allow access to your breasts for breastfeeding without taking off your entire bra or top. Avoid wearing underwire-style bras or extremely tight bras. °· Air dry   your nipples for 3-4 minutes after each feeding. °· Use only cotton bra pads to absorb leaked breast milk. Leaking of breast milk between feedings is normal. °· Use lanolin on your nipples after breastfeeding. Lanolin helps to maintain your skin's normal moisture barrier. If you use pure lanolin, you do not need to wash it off before feeding your baby again. Pure lanolin is not toxic to your baby. You may also hand express a few drops of breast milk and gently massage that milk into your nipples and allow the milk to air dry. °In the first few weeks after giving birth, some women experience extremely full breasts (engorgement). Engorgement can make your breasts feel heavy, warm, and tender to the touch. Engorgement peaks within 3-5 days after you give birth. The following recommendations can help ease engorgement: °· Completely empty your breasts while breastfeeding or pumping. You may want to start by applying warm, moist heat (in the shower or with warm water-soaked hand towels) just before feeding or pumping. This increases circulation and helps the milk flow. If your baby does not completely empty your breasts while breastfeeding, pump any extra milk after he or she is finished. °· Wear a snug bra (nursing or regular) or tank top for 1-2 days to signal your body to slightly decrease milk production. °· Apply ice packs to your breasts, unless this is too uncomfortable for you. °· Make sure that your baby is latched on and positioned properly while breastfeeding. °If engorgement persists after 48 hours of following these recommendations, contact your health care provider or a lactation consultant. °OVERALL HEALTH CARE RECOMMENDATIONS WHILE BREASTFEEDING °· Eat healthy foods. Alternate between meals and snacks, eating 3 of each per day. Because  what you eat affects your breast milk, some of the foods may make your baby more irritable than usual. Avoid eating these foods if you are sure that they are negatively affecting your baby. °· Drink milk, fruit juice, and water to satisfy your thirst (about 10 glasses a day). °· Rest often, relax, and continue to take your prenatal vitamins to prevent fatigue, stress, and anemia. °· Continue breast self-awareness checks. °· Avoid chewing and smoking tobacco. Chemicals from cigarettes that pass into breast milk and exposure to secondhand smoke may harm your baby. °· Avoid alcohol and drug use, including marijuana. °Some medicines that may be harmful to your baby can pass through breast milk. It is important to ask your health care provider before taking any medicine, including all over-the-counter and prescription medicine as well as vitamin and herbal supplements. °It is possible to become pregnant while breastfeeding. If birth control is desired, ask your health care provider about options that will be safe for your baby. °SEEK MEDICAL CARE IF: °· You feel like you want to stop breastfeeding or have become frustrated with breastfeeding. °· You have painful breasts or nipples. °· Your nipples are cracked or bleeding. °· Your breasts are red, tender, or warm. °· You have a swollen area on either breast. °· You have a fever or chills. °· You have nausea or vomiting. °· You have drainage other than breast milk from your nipples. °· Your breasts do not become full before feedings by the fifth day after you give birth. °· You feel sad and depressed. °· Your baby is too sleepy to eat well. °· Your baby is having trouble sleeping.   °· Your baby is wetting less than 3 diapers in a 24-hour period. °· Your baby has less than 3 stools in   a 24-hour period. °· Your baby's skin or the white part of his or her eyes becomes yellow.   °· Your baby is not gaining weight by 5 days of age. °SEEK IMMEDIATE MEDICAL CARE IF: °· Your baby  is overly tired (lethargic) and does not want to wake up and feed. °· Your baby develops an unexplained fever. °  °This information is not intended to replace advice given to you by your health care provider. Make sure you discuss any questions you have with your health care provider. °  °Document Released: 06/28/2005 Document Revised: 03/19/2015 Document Reviewed: 12/20/2012 °[employer]vier Interactive Patient Education ©2016 [employer] ° ° °

## 2015-04-27 NOTE — Discharge Summary (Signed)
OB Discharge Summary     Patient Name: Alison Mclean DOB: 29-Jan-1996 MRN: [id number]  Date of admission: 04/24/2015 Delivering MD: Casey BurkittFITZGERALD, HILLARY MOEN   Date of discharge: 04/27/2015  Admitting diagnosis: WATER BROKE Intrauterine pregnancy: 6782w1d     Secondary diagnosis: none     Discharge diagnosis: Term Pregnancy delivered, mild PPH                                                                                            Post partum procedures:n/a  Augmentation: Pitocin, Cytotec and Foley Balloon  [facility]  Hospital course:  Onset of Labor With Vaginal Delivery     19 y.o. yo G1P1001 at 8182w1d was admitted in Latent Laboron 04/24/2015. Patient had an uncomplicated labor course as follows:  Membrane Rupture Time/Date: 7:45 AM ,04/24/2015   Intrapartum Procedures: Episiotomy: None [1]                                         Lacerations:  None [1]  Patient had a delivery of a Viable infant. 04/25/2015  Information for the patient's newborn:  Mclean, Boy Arabel [[id number]][[id number]]  Delivery Method: Vaginal, Spontaneous Delivery (Filed from Delivery Summary)    Pateint had an uncomplicated postpartum course.  She is ambulating, tolerating a regular diet, passing flatus, and urinating well. Patient is discharged home in stable condition on No discharge date for patient encounter.Marland Kitchen.    Physical exam  Filed Vitals:   04/25/15 1824 04/26/15 0517 04/26/15 1800 04/27/15 0603  BP:  108/47 118/51 115/78  Pulse:  61 70 60  Temp:   98.5 F (36.9 C) 98.2 F (36.8 C)  TempSrc: Oral  Oral Oral  Resp:  16 20 18   Height:      Weight:      SpO2:   100%    General: alert, cooperative and no distress Lochia: appropriate Uterine Fundus: firm Incision: N/A DVT Evaluation: No evidence of DVT seen on physical exam. Negative Homan's sign. No cords or calf tenderness. No significant calf/ankle edema. Labs: Lab Results  Component Value Date   WBC 11.5*  04/26/2015   HGB 9.9* 04/26/2015   HCT 29.4* 04/26/2015   MCV 90.7 04/26/2015   PLT 233 04/26/2015   CMP Latest Ref Rng 02/26/2015  Glucose 65 - 99 mg/dL 78  BUN 7 - 25 mg/dL 10  Creatinine 9.520.50 - 8.411.10 mg/dL 3.240.61  Sodium 401135 - 027146 mmol/L 137  Potassium 3.5 - 5.3 mmol/L 4.0  Chloride 98 - 110 mmol/L 102  CO2 20 - 31 mmol/L 22  Calcium 8.6 - 10.2 mg/dL 8.9  Total Protein 6.1 - 8.1 g/dL 2.5(D5.8(L)  Total Bilirubin 0.2 - 1.2 mg/dL 0.3  Alkaline Phos 33 - 115 U/L 176(H)  AST 10 - 30 U/L 15  ALT 6 - 29 U/L 14    Discharge instruction: per After Visit Summary and "Baby and Me Booklet".  Medications:  Current facility-administered medications:  .  acetaminophen (TYLENOL) tablet 650 mg, 650 mg, Oral, Q4H PRN,  Hillary Percell Boston, MD .  benzocaine-Menthol [facility]) 20-0.5 % topical spray 1 application, 1 application, Topical, PRN, Casey Burkitt, MD, 1 application at 04/25/15 1400 .  witch hazel-glycerin (TUCKS) pad 1 application, 1 application, Topical, PRN **AND** dibucaine (NUPERCAINAL) 1 % rectal ointment 1 application, 1 application, Rectal, PRN, Casey Burkitt, MD .  diphenhydrAMINE (BENADRYL) capsule 25 mg, 25 mg, Oral, Q6H PRN, Casey Burkitt, MD .  famotidine (PEPCID) tablet 20 mg, 20 mg, Oral, PRN, Jacklyn Shell, CNM .  ibuprofen (ADVIL,MOTRIN) tablet 600 mg, 600 mg, Oral, 4 times per day, Casey Burkitt, MD, 600 mg at 04/27/15 0548 .  lanolin ointment, , Topical, PRN, Casey Burkitt, MD .  ondansetron [facility]) tablet 4 mg, 4 mg, Oral, Q4H PRN **OR** ondansetron (ZOFRAN) injection 4 mg, 4 mg, Intravenous, Q4H PRN, Casey Burkitt, MD .  oxyCODONE-acetaminophen (PERCOCET/ROXICET) 5-325 MG per tablet 1 tablet, 1 tablet, Oral, Q4H PRN, Casey Burkitt, MD .  oxyCODONE-acetaminophen (PERCOCET/ROXICET) 5-325 MG per tablet 2 tablet, 2 tablet, Oral, Q4H PRN, Casey Burkitt, MD .  pneumococcal 23 valent  vaccine (PNU-IMMUNE) injection 0.5 mL, 0.5 mL, Intramuscular, Tomorrow-1000, Ugonna A Anyanwu, MD, 0.5 mL at 04/26/15 1014 .  prenatal multivitamin tablet 1 tablet, 1 tablet, Oral, Q1200, Casey Burkitt, MD, 1 tablet at 04/26/15 1233 .  senna-docusate (Senokot-S) tablet 2 tablet, 2 tablet, Oral, Q24H, Casey Burkitt, MD, 2 tablet at 04/26/15 2332 .  simethicone (MYLICON) chewable tablet 80 mg, 80 mg, Oral, PRN, Casey Burkitt, MD .  zolpidem (AMBIEN) tablet 5 mg, 5 mg, Oral, QHS PRN, Casey Burkitt, MD  Diet: routine diet  Activity: Advance as tolerated. Pelvic rest for 6 weeks.   Outpatient follow up:6 weeks  Postpartum contraception: COCs vs nexplanon, abstinence until decides  Newborn Data: Live born female  Birth Weight: 7 lb 4.6 oz (3305 g) APGAR: 9, 9  Baby Feeding: Bottle and Breast Disposition:home with mother  Outpatient circ   04/27/2015 Marge Duncans, CNM

## 2015-04-29 ENCOUNTER — Telehealth (HOSPITAL_COMMUNITY): Payer: Self-pay | Admitting: *Deleted

## 2015-04-29 ENCOUNTER — Other Ambulatory Visit: Payer: No Typology Code available for payment source

## 2015-04-29 NOTE — Telephone Encounter (Signed)
Preadmission screen  

## 2015-05-01 ENCOUNTER — Inpatient Hospital Stay (HOSPITAL_COMMUNITY): Admission: RE | Admit: 2015-05-01 | Payer: No Typology Code available for payment source | Source: Ambulatory Visit

## 2015-06-09 ENCOUNTER — Ambulatory Visit (INDEPENDENT_AMBULATORY_CARE_PROVIDER_SITE_OTHER): Payer: Medicaid Other | Admitting: Obstetrics and Gynecology

## 2015-06-09 ENCOUNTER — Encounter: Payer: Self-pay | Admitting: Obstetrics and Gynecology

## 2015-06-09 VITALS — BP 123/69 | HR 73 | Temp 97.9°F | Ht 64.0 in | Wt 183.1 lb

## 2015-06-09 DIAGNOSIS — Z309 Encounter for contraceptive management, unspecified: Secondary | ICD-10-CM | POA: Diagnosis not present

## 2015-06-09 DIAGNOSIS — Z3202 Encounter for pregnancy test, result negative: Secondary | ICD-10-CM | POA: Diagnosis not present

## 2015-06-09 DIAGNOSIS — Z3009 Encounter for other general counseling and advice on contraception: Secondary | ICD-10-CM

## 2015-06-09 DIAGNOSIS — F53 Postpartum depression: Secondary | ICD-10-CM

## 2015-06-09 DIAGNOSIS — O99345 Other mental disorders complicating the puerperium: Secondary | ICD-10-CM

## 2015-06-09 LAB — POCT PREGNANCY, URINE: PREG TEST UR: NEGATIVE

## 2015-06-09 MED ORDER — NORGESTIMATE-ETH ESTRADIOL 0.25-35 MG-MCG PO TABS: 1.0000 | ORAL_TABLET | Freq: Every day | ORAL | Status: DC

## 2015-06-09 MED ORDER — SERTRALINE HCL 100 MG PO TABS: 100.0000 mg | ORAL_TABLET | Freq: Every day | ORAL | Status: AC

## 2015-06-09 NOTE — Progress Notes (Signed)
Patient ID: Alison Mclean, female   DOB: 06/19/1996, 19 y.o.   MRN: [id number] Subjective:     Alison Mclean is a 19 y.o. female who presents for a postpartum visit. She is 6 weeks postpartum following a spontaneous vaginal delivery. I have fully reviewed the prenatal and intrapartum course. The delivery was at 38 gestational weeks. Outcome: spontaneous vaginal delivery. Anesthesia: epidural. Postpartum course has been uncomplicated. Baby's course has been uncomplicated. Baby is feeding by bottle - Enfamil with Iron. Bleeding no bleeding. Bowel function is normal. Bladder function is normal. Patient is not sexually active. Contraception method is abstinence and OCP (estrogen/progesterone). Postpartum depression screening: negative.     Review of Systems Pertinent items are noted in HPI.   Objective:    There were no vitals taken for this visit.  General:  alert, cooperative and no distress   Breasts:  inspection negative, no nipple discharge or bleeding, no masses or nodularity palpable  Lungs: clear to auscultation bilaterally  Heart:  regular rate and rhythm  Abdomen: soft, non-tender; bowel sounds normal; no masses,  no organomegaly   Vulva:  normal  Vagina: normal vagina, no discharge, exudate, lesion, or erythema  Cervix:  multiparous appearance  Corpus: normal size, contour, position, consistency, mobility, non-tender  Adnexa:  no mass, fullness, tenderness  Rectal Exam: Not performed.        Assessment:     Normal postpartum exam. Pap smear not done at today's visit.   Plan:    1. Contraception: OCP (estrogen/progesterone). Rx Sprintec provided 2. Patient is medically cleared to resume all activities of daily living 3. Patient with bordeline screen for pp depression. She denies suicidal/homicidal ideations. She has a history of depression but does not have a therapist at this time. She is interested in having a referral. Rx zoloft also provided until she is seen by a  provider. Patient feels this will be beneficial for her right now until she gets used to her new routine 4. Follow up as needed.

## 2015-06-09 NOTE — Patient Instructions (Signed)
Postpartum Depression and Baby Blues °The postpartum period begins right after the birth of a baby. During this time, there is often a great amount of joy and excitement. It is also a time of many changes in the life of the parents. Regardless of how many times a mother gives birth, each child brings new challenges and dynamics to the family. It is not unusual to have feelings of excitement along with confusing shifts in moods, emotions, and thoughts. All mothers are at risk of developing postpartum depression or the "baby blues." These mood changes can occur right after giving birth, or they may occur many months after giving birth. The baby blues or postpartum depression can be mild or severe. Additionally, postpartum depression can go away rather quickly, or it can be a long-term condition.  °CAUSES °Raised hormone levels and the rapid drop in those levels are thought to be a main cause of postpartum depression and the baby blues. A number of hormones change during and after pregnancy. Estrogen and progesterone usually decrease right after the delivery of your baby. The levels of thyroid hormone and various cortisol steroids also rapidly drop. Other factors that play a role in these mood changes include major life events and genetics.  °RISK FACTORS °If you have any of the following risks for the baby blues or postpartum depression, know what symptoms to watch out for during the postpartum period. Risk factors that may increase the likelihood of getting the baby blues or postpartum depression include: °· Having a personal or family history of depression.   °· Having depression while being pregnant.   °· Having premenstrual mood issues or mood issues related to oral contraceptives. °· Having a lot of life stress.   °· Having marital conflict.   °· Lacking a social support network.   °· Having a baby with special needs.   °· Having health problems, such as diabetes.   °SIGNS AND SYMPTOMS °Symptoms of baby blues  include: °· Brief changes in mood, such as going from extreme happiness to sadness. °· Decreased concentration.   °· Difficulty sleeping.   °· Crying spells, tearfulness.   °· Irritability.   °· Anxiety.   °Symptoms of postpartum depression typically begin within the first month after giving birth. These symptoms include: °· Difficulty sleeping or excessive sleepiness.   °· Marked weight loss.   °· Agitation.   °· Feelings of worthlessness.   °· Lack of interest in activity or food.   °Postpartum psychosis is a very serious condition and can be dangerous. Fortunately, it is rare. Displaying any of the following symptoms is cause for immediate medical attention. Symptoms of postpartum psychosis include:  °· Hallucinations and delusions.   °· Bizarre or disorganized behavior.   °· Confusion or disorientation.   °DIAGNOSIS  °A diagnosis is made by an evaluation of your symptoms. There are no medical or lab tests that lead to a diagnosis, but there are various questionnaires that a health care provider may use to identify those with the baby blues, postpartum depression, or psychosis. Often, a screening tool called the Edinburgh Postnatal Depression Scale is used to diagnose depression in the postpartum period.  °TREATMENT °The baby blues usually goes away on its own in 1-2 weeks. Social support is often all that is needed. You will be encouraged to get adequate sleep and rest. Occasionally, you may be given medicines to help you sleep.  °Postpartum depression requires treatment because it can last several months or longer if it is not treated. Treatment may include individual or group therapy, medicine, or both to address any social, physiological, and psychological   factors that may play a role in the depression. Regular exercise, a healthy diet, rest, and social support may also be strongly recommended.  °Postpartum psychosis is more serious and needs treatment right away. Hospitalization is often needed. °HOME CARE  INSTRUCTIONS °· Get as much rest as you can. Nap when the baby sleeps.   °· Exercise regularly. Some women find yoga and walking to be beneficial.   °· Eat a balanced and nourishing diet.   °· Do little things that you enjoy. Have a cup of tea, take a bubble bath, read your favorite magazine, or listen to your favorite music. °· Avoid alcohol.   °· Ask for help with household chores, cooking, grocery shopping, or running errands as needed. Do not try to do everything.   °· Talk to people close to you about how you are feeling. Get support from your partner, family members, friends, or other new moms. °· Try to stay positive in how you think. Think about the things you are grateful for.   °· Do not spend a lot of time alone.   °· Only take over-the-counter or prescription medicine as directed by your health care provider. °· Keep all your postpartum appointments.   °· Let your health care provider know if you have any concerns.   °SEEK MEDICAL CARE IF: °You are having a reaction to or problems with your medicine. °SEEK IMMEDIATE MEDICAL CARE IF: °· You have suicidal feelings.   °· You think you may harm the baby or someone else. °MAKE SURE YOU: °· Understand these instructions. °· Will watch your condition. °· Will get help right away if you are not doing well or get worse. °  °This information is not intended to replace advice given to you by your health care provider. Make sure you discuss any questions you have with your health care provider. °  °Document Released: 04/01/2004 Document Revised: 07/03/2013 Document Reviewed: 04/09/2013 °Elsevier Interactive Patient Education ©2016 [employer] Inc. ° °

## 2015-07-01 ENCOUNTER — Telehealth: Payer: Self-pay | Admitting: *Deleted

## 2015-07-01 NOTE — Telephone Encounter (Signed)
Pt left message stating that she was seen in the clinic on 11/28 and given Rx for OCP. Her Medicaid is about to run out and she wants to know if there is a less expensive pill that she can have. I returned pt's call and advised her [facility] [place] where the pills will cost $9-10.  Pt voiced understanding.

## 2016-02-12 IMAGING — US US MFM FETAL BPP W/O NON-STRESS
1 series · 5 of 5 positions shown · non-contrast
Comparison: none

OBSTETRICS REPORT
(Signed Final 04/09/2015 [DATE])

Date:
Service(s) Provided
Indications
Pyelectasis of fetus on prenatal ultrasound
35 weeks gestation of pregnancy
Polyhydramnios, third trimester, antepartum
condition or complication, unspecified fetus
Abnormal ultrasound finding on antenatal
screening of mother
Fetal Evaluation
Num Of             1
Fetuses:
Fetal Heart        148                          bpm
Rate:
Cardiac Activity:  Observed
Presentation:      Cephalic
Placenta:          Anterior, above cervical
os
P. Cord            Previously Visualized
Insertion:
Amniotic Fluid
AFI FV:      Polyhydramnios
AFI Sum:     25.42    cm      95  %Tile     Larg Pckt:    8.41   cm
RUQ:   5.07    cm    RLQ:   7.97    cm   LUQ:    3.97    cm   LLQ:    8.41   cm
Biophysical Evaluation
Amniotic F.V:   Polyhydramnios              F. Tone:        Observed
F. Movement:    Observed                    Score:          [DATE]
F. Breathing:   Observed
Biometry
BPD:       96   m    G. Age:   39w 1d                 CI:         77.3   70 - 86
m
OFD:    124.2   m                                     FL/HC:      19.1   20.1 -
HC:     351.1   m    G. Age:   40w 6d      > 97  %    HC/AC:      1.03   0.93 -
AC:     342.5   m    G. Age:   38w 1d      > 97  %    FL/BPD      69.8   71 - 87
m                                     :
FL:        67   m    G. Age:   34w 4d        15  %    FL/AC:      19.6   20 - 24
HUM:     58.3   m    G. Age:   33w 5d        28  %
Est.        1545   gm    7 lb 4 oz    > 90   %
FW:
Gestational Age
LMP:           35w 6d        Date:  08/01/14                  EDD:   05/08/15
U/S Today:     38w 1d                                         EDD:   04/22/15
Best:          35w 6d    Det. By:   LMP  (08/01/14)           EDD:   05/08/15
Anatomy
Cranium:          Previously seen        Aortic Arch:       Previously seen
Fetal Cavum:      Previously seen        Ductal Arch:       Previously seen
Ventricles:       Appears normal         Diaphragm:         Appears normal
Choroid Plexus:   Previously seen        Stomach:           Appears normal,
left sided
Cerebellum:       Previously seen        Abdomen:           Previously seen
Posterior         Previously seen        Abdominal          Previously seen
Fossa:                                   Wall:
Nuchal Fold:      Previously seen        Cord Vessels:      Previously seen
Face:             Orbits and profile     Kidneys:           Bilateral UTD
previously seen
Lips:             Previously seen        Bladder:           Appears normal
Heart:            Appears normal         Spine:             Previously seen
(4CH, axis, and
situs)
RVOT:             Previously seen        Lower              Previously seen
Extremities:
LVOT:             Previously seen        Upper              Previously seen
Other:   Male gender. Heels and 5th digit previously seen.
Cervix Uterus Adnexa
Cervix:       Not visualized (advanced GA >82wks)
Impression
INDICATION: 19 yr old G1P0 at 73w4d with fetal urinary tract
dilation for follow up ultrasound.

[Series 1: us mfm fetal bpp w/o non-stress · 5 acquisitions, 5 frames shown]
[im 1/5]
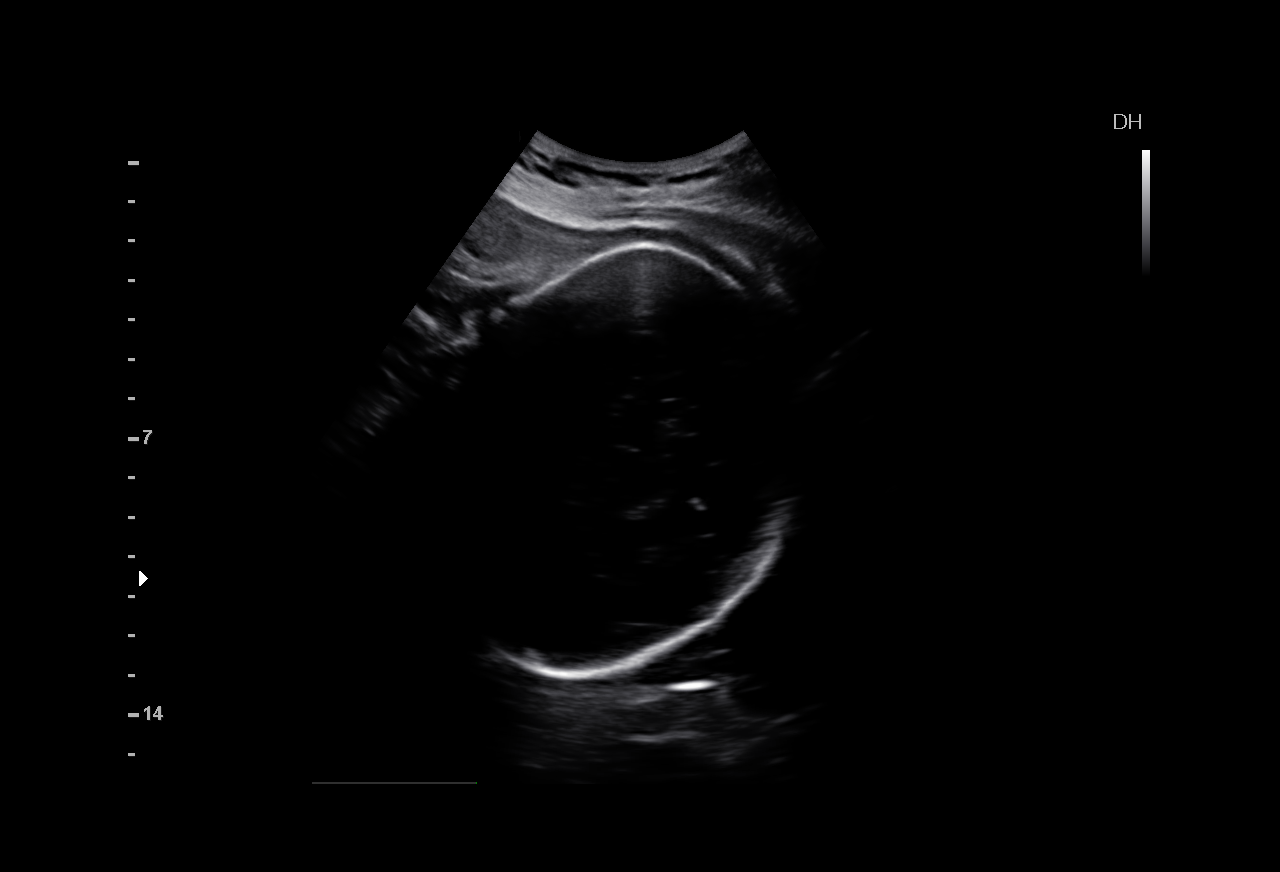
[im 2/5]
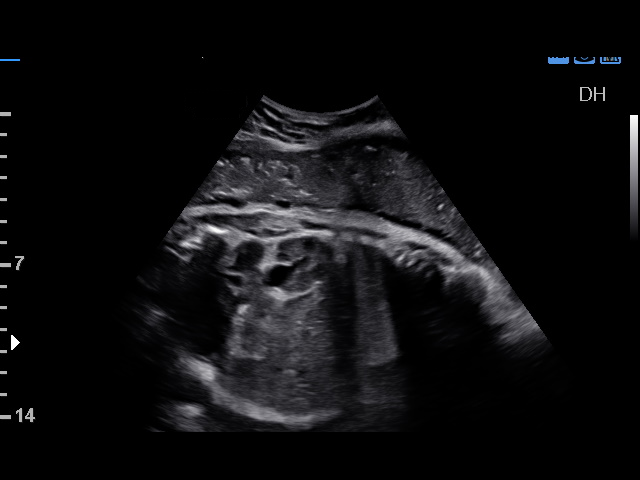
[im 3/5]
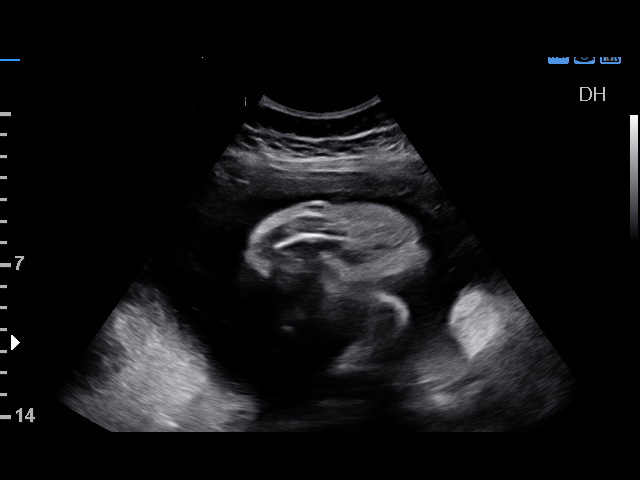
[im 4/5]
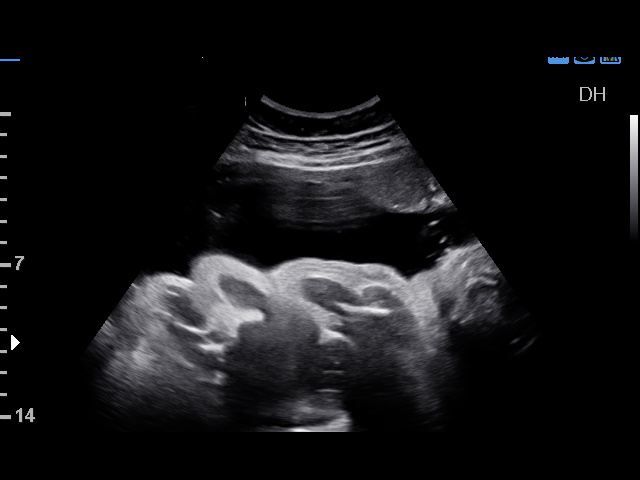
[im 5/5]
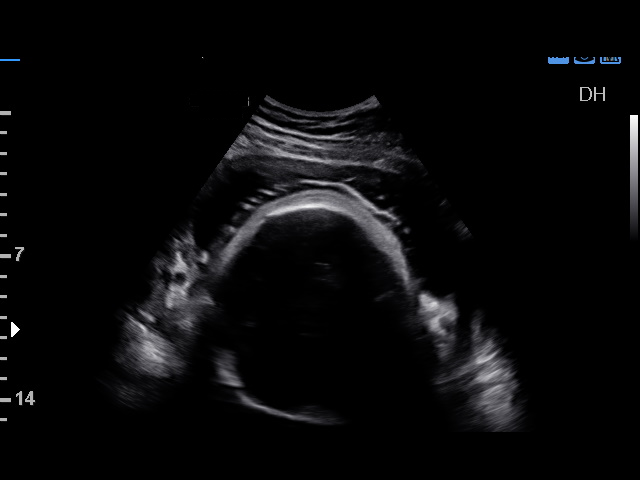

[5 of 5 positions shown; findings below may reference images not displayed]

FINDINGS: 1. Single intrauterine pregnancy.
2. Estimated fetal weight is in the >90th%.
3. Anterior placenta without evidence of previa.
4. Polyhydramnios with AFI of 25.42cm.
5. Again seen is bilateral urinary tract dilation- the right
renal pelvis measures 9-10mm; the left measures 7.6mm.
6. The remainder of the limited anatomy survey is normal.
7. Normal biophysical profile of [DATE].
Recommendations

1. Fetal macrosomia:
- may repeat fetal growth in 3 weeks if desired to aid in
delivery planning
- normal glucola
2. Fetal urinary tract dilation:
- counseled that this may be a benign variant given minimal
dilation
- also discussed possible anatomic malformation or reflux
- recommend inform Pediatrics at delivery
- may need neonatal ultrasound, VCUG, antibiotics, or
rarely surgical correction
- also discussed slight association with increased risk of
trisomy 21 when found in the second trimester- patient had
low risk first trimester screen
3. Polyhydramnios:
- unclear etiology
- had normal glucola
- discussed increased risk of preterm labor, PPROM,
stillbirth, and preterm delivery
- recommend antenatal testing- BPP [DATE] today
- recommend fetal Kim Carlo [place]
- normal stomach and fetal movement seen

## 2016-03-18 ENCOUNTER — Telehealth: Payer: Self-pay | Admitting: *Deleted

## 2016-03-18 DIAGNOSIS — Z3009 Encounter for other general counseling and advice on contraception: Secondary | ICD-10-CM

## 2016-03-18 NOTE — Telephone Encounter (Signed)
Pt left message stating that she had her baby in October 2016.  She received Rx for OCP's which will end in November.  She wants to know if she will need to be seen for appointment in order to get a refill

## 2016-04-15 MED ORDER — NORGESTIMATE-ETH ESTRADIOL 0.25-35 MG-MCG PO TABS: 1.0000 | ORAL_TABLET | Freq: Every day | ORAL | 11 refills | Status: AC

## 2016-04-15 NOTE — Telephone Encounter (Signed)
Pt is currently uninsured and wanted to know if she had to be seen to get a refill on her birth control. She will have insurance in January and wanted to see if we can give her a refill. I gave her information for free pap screening and advised her that she would need to see [place] at some point for [place] to continue refilling her birth control. Patient is agreeable and has no further questions or concerns.

## 2018-04-19 ENCOUNTER — Encounter: Payer: Self-pay | Admitting: Obstetrics and Gynecology

## 2018-04-19 ENCOUNTER — Ambulatory Visit: Payer: Self-pay | Admitting: Obstetrics and Gynecology

## 2018-04-20 NOTE — Progress Notes (Signed)
Patient did not keep her GYN appointment for 04/19/2018.  Parthiv Mucci, Jr MD Attending [facility] (Faculty Practice)   

## 2024-07-02 ENCOUNTER — Other Ambulatory Visit: Payer: Self-pay

## 2024-07-02 ENCOUNTER — Encounter (HOSPITAL_COMMUNITY): Payer: Self-pay

## 2024-07-02 ENCOUNTER — Emergency Department (HOSPITAL_COMMUNITY)
Admission: EM | Admit: 2024-07-02 | Discharge: 2024-07-03 | Disposition: A | Discharge status: Home / Self Care | Payer: Self-pay | Attending: Emergency Medicine | Admitting: Emergency Medicine

## 2024-07-02 DIAGNOSIS — J101 Influenza due to other identified influenza virus with other respiratory manifestations: Secondary | ICD-10-CM | POA: Insufficient documentation

## 2024-07-02 DIAGNOSIS — E876 Hypokalemia: Secondary | ICD-10-CM | POA: Insufficient documentation

## 2024-07-02 NOTE — ED Triage Notes (Signed)
 Pt. Arrives pov for generalized body aches, sore throat and nausea. 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

## 2024-07-03 LAB — CBC WITH DIFFERENTIAL/PLATELET
Abs Immature Granulocytes: 0.01 K/uL (ref 0.00–0.07)
Basophils Absolute: 0 K/uL (ref 0.0–0.1)
Basophils Relative: 1 %
Eosinophils Absolute: 0 K/uL (ref 0.0–0.5)
Eosinophils Relative: 0 %
HCT: 39.2 % (ref 36.0–46.0)
Hemoglobin: 13.9 g/dL (ref 12.0–15.0)
Immature Granulocytes: 0 %
Lymphocytes Relative: 5 %
Lymphs Abs: 0.3 K/uL — ABNORMAL LOW (ref 0.7–4.0)
MCH: 30.7 pg (ref 26.0–34.0)
MCHC: 35.5 g/dL (ref 30.0–36.0)
MCV: 86.5 fL (ref 80.0–100.0)
Monocytes Absolute: 0.5 K/uL (ref 0.1–1.0)
Monocytes Relative: 8 %
Neutro Abs: 5.6 K/uL (ref 1.7–7.7)
Neutrophils Relative %: 86 %
Platelets: 296 K/uL (ref 150–400)
RBC: 4.53 MIL/uL (ref 3.87–5.11)
RDW: 11.8 % (ref 11.5–15.5)
WBC: 6.4 K/uL (ref 4.0–10.5)
nRBC: 0 % (ref 0.0–0.2)

## 2024-07-03 LAB — COMPREHENSIVE METABOLIC PANEL WITH GFR
ALT: 13 U/L (ref 0–44)
AST: 18 U/L (ref 15–41)
Albumin: 4.4 g/dL (ref 3.5–5.0)
Alkaline Phosphatase: 94 U/L (ref 38–126)
Anion gap: 14 (ref 5–15)
BUN: 7 mg/dL (ref 6–20)
CO2: 23 mmol/L (ref 22–32)
Calcium: 9.9 mg/dL (ref 8.9–10.3)
Chloride: 100 mmol/L (ref 98–111)
Creatinine, Ser: 0.67 mg/dL (ref 0.44–1.00)
GFR, Estimated: >60 mL/min
Glucose, Bld: 110 mg/dL — ABNORMAL HIGH (ref 70–99)
Potassium: 3.4 mmol/L — ABNORMAL LOW (ref 3.5–5.1)
Sodium: 136 mmol/L (ref 135–145)
Total Bilirubin: 0.7 mg/dL (ref 0.0–1.2)
Total Protein: 7.6 g/dL (ref 6.5–8.1)

## 2024-07-03 LAB — RESP PANEL BY RT-PCR (RSV, FLU A&B, COVID)  RVPGX2
Influenza A by PCR: POSITIVE — AB
Influenza B by PCR: NEGATIVE
Resp Syncytial Virus by PCR: NEGATIVE
SARS Coronavirus 2 by RT PCR: NEGATIVE

## 2024-07-03 MED ORDER — IBUPROFEN 800 MG PO TABS
800.0000 mg | ORAL_TABLET | Freq: Once | ORAL | Status: AC
  Administered 2024-07-03: 800 mg via ORAL
  Filled 2024-07-03: qty 1

## 2024-07-03 NOTE — ED Provider Notes (Signed)
 " [place] EMERGENCY DEPARTMENT AT [facility] Provider Note   CSN: [id number] Arrival date & time: 07/02/24  2219     Patient presents with: Generalized Body Aches   Alison Mclean is a 28 y.o. female.   28 year old female presents with complaint of cough, sore throat, body aches and back pain.  Symptoms started around 5 PM today.  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  Has not had a flu vaccine this year.  No other complaints or concerns.       Prior to Admission medications  Medication Sig Start Date End Date Taking? Authorizing Provider  ibuprofen  (ADVIL ,MOTRIN ) 600 MG tablet Take 1 tablet (600 mg total) by mouth every 6 (six) hours as needed for mild pain, moderate pain or cramping. 04/27/15   Kizzie Suzen SAUNDERS, CNM  norgestimate -ethinyl estradiol  (ORTHO-CYCLEN,SPRINTEC,PREVIFEM) 0.25-35 MG-MCG tablet Take 1 tablet by mouth daily. 04/15/16   Stinson, Jacob J, DO  sertraline  (ZOLOFT ) 100 MG tablet Take 1 tablet (100 mg total) by mouth daily. 06/09/15   Constant, Peggy, MD    Allergies: Vicodin [hydrocodone-acetaminophen ]    Review of Systems Negative except as per HPI Updated Vital Signs BP (!) 136/92 (BP Location: Left Arm)   Pulse (!) 112   Temp 98.2 F (36.8 C) (Oral)   Resp 20   SpO2 100%   Physical Exam Vitals and nursing note reviewed.  Constitutional:      General: She is not in acute distress.    Appearance: She is well-developed. She is not diaphoretic.  HENT:     Head: Normocephalic and atraumatic.     Right Ear: Tympanic membrane and ear canal normal.     Left Ear: Tympanic membrane and ear canal normal.     Nose: Congestion present.     Mouth/Throat:     Mouth: Mucous membranes are moist.  Eyes:     Conjunctiva/sclera: Conjunctivae normal.  Cardiovascular:     Rate and Rhythm: Normal rate and regular rhythm.     Heart sounds: Normal heart sounds.  Pulmonary:     Effort:  Pulmonary effort is normal.     Breath sounds: Normal breath sounds.  Musculoskeletal:     Cervical back: Neck supple.  Skin:    General: Skin is warm and dry.     Findings: No erythema or rash.  Neurological:     Mental Status: She is alert and oriented to person, place, and time.  Psychiatric:        Behavior: Behavior normal.     (all labs ordered are listed, but only abnormal results are displayed) Labs Reviewed  RESP PANEL BY RT-PCR (RSV, FLU A&B, COVID)  RVPGX2 - Abnormal; Notable for the following components:      Result Value   Influenza A by PCR POSITIVE (*)    All other components within normal limits  CBC WITH DIFFERENTIAL/PLATELET - Abnormal; Notable for the following components:   Lymphs Abs 0.3 (*)    All other components within normal limits  COMPREHENSIVE METABOLIC PANEL WITH GFR - Abnormal; Notable for the following components:   Potassium 3.4 (*)    Glucose, Bld 110 (*)    All other components within normal limits    EKG: None  Radiology: No results found.   Procedures   Medications Ordered in the ED  ibuprofen  (ADVIL ) tablet 800 mg (has no administration in time range)  Medical Decision Making  28 year old female presents emergency room with complaint as above.  On exam, appears to feel unwell although nontoxic and in no distress.  Mild nasal congestion on exam otherwise no significant findings.  Suspect likely viral illness. Patient is positive for influenza A, negative for COVID, flu B, RSV.  Labs reviewed and generally reassuring occluding CBC and CMP with very mild hypokalemia at 3.4. EKG with sinus tachycardia, rate 106. Recommend supportive measures, Motrin  and Tylenol , fluids.  Follow-up with PCP as needed return to ER for severe concerning symptoms.     Final diagnoses:  Influenza A    ED Discharge Orders     None          Beverley Leita DELENA DEVONNA 07/03/24 0104    Trine Raynell Moder,  MD 07/03/24 [phone]  "
